# Patient Record
Sex: Female | Born: 1970 | Race: Black or African American | Hispanic: No | Marital: Single | Smoking: Never smoker
Health system: Southern US, Community
[De-identification: ages and names within clinical notes are randomized; demographics above are authoritative.]

## PROBLEM LIST (undated history)

## (undated) DIAGNOSIS — T783XXA Angioneurotic edema, initial encounter: Secondary | ICD-10-CM

## (undated) DIAGNOSIS — E669 Obesity, unspecified: Secondary | ICD-10-CM

## (undated) DIAGNOSIS — M1990 Unspecified osteoarthritis, unspecified site: Secondary | ICD-10-CM

## (undated) DIAGNOSIS — I10 Essential (primary) hypertension: Secondary | ICD-10-CM

## (undated) DIAGNOSIS — R928 Other abnormal and inconclusive findings on diagnostic imaging of breast: Secondary | ICD-10-CM

## (undated) DIAGNOSIS — T464X5A Adverse effect of angiotensin-converting-enzyme inhibitors, initial encounter: Secondary | ICD-10-CM

## (undated) DIAGNOSIS — E119 Type 2 diabetes mellitus without complications: Secondary | ICD-10-CM

## (undated) HISTORY — DX: Obesity, unspecified: E66.9

## (undated) HISTORY — PX: CHOLECYSTECTOMY: SHX55

## (undated) HISTORY — PX: APPENDECTOMY: SHX54

## (undated) HISTORY — DX: Adverse effect of angiotensin-converting-enzyme inhibitors, initial encounter: T46.4X5A

## (undated) HISTORY — DX: Angioneurotic edema, initial encounter: T78.3XXA

## (undated) HISTORY — DX: Essential (primary) hypertension: I10

## (undated) HISTORY — DX: Type 2 diabetes mellitus without complications: E11.9

## (undated) HISTORY — DX: Unspecified osteoarthritis, unspecified site: M19.90

## (undated) HISTORY — DX: Other abnormal and inconclusive findings on diagnostic imaging of breast: R92.8

---

## 1999-08-06 ENCOUNTER — Inpatient Hospital Stay (HOSPITAL_COMMUNITY): Admission: AD | Admit: 1999-08-06 | Discharge: 1999-08-06 | Payer: Self-pay | Admitting: Obstetrics & Gynecology

## 1999-08-06 ENCOUNTER — Encounter (INDEPENDENT_AMBULATORY_CARE_PROVIDER_SITE_OTHER): Payer: Self-pay | Admitting: Specialist

## 1999-11-24 ENCOUNTER — Encounter (INDEPENDENT_AMBULATORY_CARE_PROVIDER_SITE_OTHER): Payer: Self-pay | Admitting: Specialist

## 1999-11-24 ENCOUNTER — Other Ambulatory Visit: Admission: RE | Admit: 1999-11-24 | Discharge: 1999-11-24 | Payer: Self-pay | Admitting: Obstetrics & Gynecology

## 1999-12-03 ENCOUNTER — Encounter: Admission: RE | Admit: 1999-12-03 | Discharge: 1999-12-03 | Payer: Self-pay | Admitting: Family Medicine

## 1999-12-03 ENCOUNTER — Encounter: Payer: Self-pay | Admitting: Family Medicine

## 1999-12-21 ENCOUNTER — Encounter (INDEPENDENT_AMBULATORY_CARE_PROVIDER_SITE_OTHER): Payer: Self-pay | Admitting: *Deleted

## 1999-12-21 ENCOUNTER — Ambulatory Visit (HOSPITAL_COMMUNITY): Admission: RE | Admit: 1999-12-21 | Discharge: 1999-12-22 | Payer: Self-pay | Admitting: General Surgery

## 2000-11-07 ENCOUNTER — Other Ambulatory Visit: Admission: RE | Admit: 2000-11-07 | Discharge: 2000-11-07 | Payer: Self-pay | Admitting: *Deleted

## 2001-05-03 ENCOUNTER — Other Ambulatory Visit: Admission: RE | Admit: 2001-05-03 | Discharge: 2001-05-03 | Payer: Self-pay | Admitting: Obstetrics and Gynecology

## 2001-11-15 ENCOUNTER — Other Ambulatory Visit: Admission: RE | Admit: 2001-11-15 | Discharge: 2001-11-15 | Payer: Self-pay | Admitting: Obstetrics and Gynecology

## 2002-11-21 ENCOUNTER — Other Ambulatory Visit: Admission: RE | Admit: 2002-11-21 | Discharge: 2002-11-21 | Payer: Self-pay | Admitting: Obstetrics and Gynecology

## 2008-08-30 HISTORY — PX: REDUCTION MAMMAPLASTY: SUR839

## 2011-01-15 NOTE — Op Note (Signed)
[place]. [facility]  Patient:    Sheila Gutierrez, Sheila Gutierrez                       MRN: [id number] Proc. Date: 12/21/99 Adm. Date:  [id number] Attending:  Glenna Fellows Tappan                           Operative Report  PREOPERATIVE DIAGNOSIS:       Symptomatic cholelithiasis.  POSTOPERATIVE DIAGNOSIS:      Symptomatic cholelithiasis.  PROCEDURE:                    Laparoscopic cholecystectomy.  SURGEON:                      Lorne Skeens. Hoxworth, M.D.  ASSISTANT:                    Rose Phi. Maple Hudson, M.D.  ANESTHESIA:                   General.  BRIEF HISTORY:  Ms. Bischof is a 40 year old black female with repeated episodes of epigastric and right upper quadrant abdominal pain.  Workup included a gallbladder ultrasound showing multiple gallstones.  She was felt to have symptomatic cholelithiasis, and laparoscopic cholecystectomy has been recommended.  The nature of the procedure, the indications, the risks of bleeding, infection, bile duct _____ injury were discussed and understood. She is now brought to the operating room for this procedure.  DESCRIPTION OF PROCEDURE:  The patient was brought to the operating room and placed in the supine position on the operating table, and a general endotracheal anesthesia was induced.  The abdomen was sterilely prepped and draped.  Local anesthesia was used to infiltrate the trocar sites.  A 1 cm incision was made at the umbilicus, and dissection was carried down to the midline fascia.  This was sharply incised for 1 cm and the peritoneum entered under direct vision.  A Hasson trocar was placed through a mattress suture of 0 Vicryl.  Under direct vision, the 10 mm trocar was placed in the subxiphoid area and two 5 mm trocars on the right subcostal margin.  The gallbladder was visualized and the fundus grasped and elevated from the liver.  Some omental adhesions and filmy adhesions to the duodenum were carefully taken down.   The infundibulum was exposed and retracted inferolaterally.  Fibrofatty tissue was stripped off the neck of the gallbladder toward the porta hepatis.  The distal gallbladder and Calots triangle were thoroughly dissected.  The cystic duct-gallbladder junction was dissected 360 degrees.  When the anatomy was clear, the cystic duct was doubly clipped proximally and clipped distally and divided.  Anterior and posterior branches of the cystic artery were controlled in Calots triangle with clips, and the gallbladder was dissected free from bed using hooks, clamps, and cautery, and removed through the umbilicus intact.  Complete hemostasis was assured in the operative site.  Trocars were removed under direct vision and all CO2 evacuated.  The pursestring suture was secured at the umbilicus.  Skin incisions were closed with a interrupted subcuticular 4-0 Monocryl and Steri-Strips.  Sponge, needle, and instrument counts were correct.  A dressing was applied and the patient taken to the recovery room in good condition. DD:  12/21/99 TD:  12/21/99 Job: [id number]

## 2012-05-30 ENCOUNTER — Other Ambulatory Visit (HOSPITAL_BASED_OUTPATIENT_CLINIC_OR_DEPARTMENT_OTHER): Payer: Self-pay | Admitting: Obstetrics and Gynecology

## 2012-05-30 DIAGNOSIS — Z1231 Encounter for screening mammogram for malignant neoplasm of breast: Secondary | ICD-10-CM

## 2012-06-02 ENCOUNTER — Ambulatory Visit (HOSPITAL_BASED_OUTPATIENT_CLINIC_OR_DEPARTMENT_OTHER)
Admission: RE | Admit: 2012-06-02 | Discharge: 2012-06-02 | Disposition: A | Discharge status: Home / Self Care | Payer: Private Health Insurance - Indemnity | Source: Ambulatory Visit | Attending: Obstetrics and Gynecology | Admitting: Obstetrics and Gynecology

## 2012-06-02 DIAGNOSIS — Z1231 Encounter for screening mammogram for malignant neoplasm of breast: Secondary | ICD-10-CM | POA: Insufficient documentation

## 2012-06-02 DIAGNOSIS — R922 Inconclusive mammogram: Secondary | ICD-10-CM | POA: Insufficient documentation

## 2012-06-05 ENCOUNTER — Other Ambulatory Visit: Payer: Self-pay | Admitting: Obstetrics and Gynecology

## 2012-06-05 DIAGNOSIS — R928 Other abnormal and inconclusive findings on diagnostic imaging of breast: Secondary | ICD-10-CM

## 2012-10-27 ENCOUNTER — Ambulatory Visit
Admission: RE | Admit: 2012-10-27 | Discharge: 2012-10-27 | Disposition: A | Discharge status: Home / Self Care | Payer: Private Health Insurance - Indemnity | Source: Ambulatory Visit | Attending: Obstetrics and Gynecology | Admitting: Obstetrics and Gynecology

## 2012-10-27 DIAGNOSIS — R928 Other abnormal and inconclusive findings on diagnostic imaging of breast: Secondary | ICD-10-CM

## 2012-11-29 ENCOUNTER — Encounter: Payer: Self-pay | Admitting: Family Medicine

## 2012-11-29 ENCOUNTER — Ambulatory Visit (INDEPENDENT_AMBULATORY_CARE_PROVIDER_SITE_OTHER): Payer: Private Health Insurance - Indemnity | Admitting: Family Medicine

## 2012-11-29 VITALS — BP 175/95 | HR 105 | Ht 71.0 in | Wt 329.0 lb

## 2012-11-29 DIAGNOSIS — Z13220 Encounter for screening for lipoid disorders: Secondary | ICD-10-CM

## 2012-11-29 DIAGNOSIS — Z131 Encounter for screening for diabetes mellitus: Secondary | ICD-10-CM

## 2012-11-29 DIAGNOSIS — R928 Other abnormal and inconclusive findings on diagnostic imaging of breast: Secondary | ICD-10-CM

## 2012-11-29 DIAGNOSIS — E669 Obesity, unspecified: Secondary | ICD-10-CM

## 2012-11-29 DIAGNOSIS — Z87898 Personal history of other specified conditions: Secondary | ICD-10-CM | POA: Insufficient documentation

## 2012-11-29 DIAGNOSIS — S134XXA Sprain of ligaments of cervical spine, initial encounter: Secondary | ICD-10-CM

## 2012-11-29 DIAGNOSIS — Z9889 Other specified postprocedural states: Secondary | ICD-10-CM | POA: Insufficient documentation

## 2012-11-29 DIAGNOSIS — R030 Elevated blood-pressure reading, without diagnosis of hypertension: Secondary | ICD-10-CM

## 2012-11-29 DIAGNOSIS — S139XXA Sprain of joints and ligaments of unspecified parts of neck, initial encounter: Secondary | ICD-10-CM

## 2012-11-29 HISTORY — DX: Obesity, unspecified: E66.9

## 2012-11-29 HISTORY — DX: Other abnormal and inconclusive findings on diagnostic imaging of breast: R92.8

## 2012-11-29 MED ORDER — DICLOFENAC SODIUM 50 MG PO TBEC: DELAYED_RELEASE_TABLET | ORAL | Status: DC

## 2012-11-29 MED ORDER — CYCLOBENZAPRINE HCL 10 MG PO TABS: ORAL_TABLET | ORAL | Status: DC
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
## 2012-12-18 ENCOUNTER — Encounter: Payer: Self-pay | Admitting: Family Medicine

## 2012-12-18 LAB — LIPID PANEL: LDL Cholesterol: 66 mg/dL (ref 0–99)

## 2012-12-18 LAB — BASIC METABOLIC PANEL WITH GFR
BUN: 11 mg/dL (ref 6–23)
Calcium: 9.5 mg/dL (ref 8.4–10.5)
Creat: 0.65 mg/dL (ref 0.50–1.10)
GFR, Est African American: >89 mL/min
GFR, Est Non African American: >89 mL/min
Glucose, Bld: 78 mg/dL (ref 70–99)

## 2012-12-18 LAB — TSH: TSH: 1.657 u[IU]/mL (ref 0.350–4.500)

## 2012-12-22 ENCOUNTER — Ambulatory Visit (INDEPENDENT_AMBULATORY_CARE_PROVIDER_SITE_OTHER): Payer: Private Health Insurance - Indemnity | Admitting: Family Medicine

## 2012-12-22 ENCOUNTER — Encounter: Payer: Self-pay | Admitting: Family Medicine

## 2012-12-22 VITALS — BP 184/103 | HR 99 | Wt 332.0 lb

## 2012-12-22 DIAGNOSIS — Z0000 Encounter for general adult medical examination without abnormal findings: Secondary | ICD-10-CM

## 2012-12-22 DIAGNOSIS — I10 Essential (primary) hypertension: Secondary | ICD-10-CM

## 2012-12-22 DIAGNOSIS — Z23 Encounter for immunization: Secondary | ICD-10-CM

## 2012-12-22 HISTORY — DX: Essential (primary) hypertension: I10

## 2012-12-22 MED ORDER — HYDROCHLOROTHIAZIDE 25 MG PO TABS: 25.0000 mg | ORAL_TABLET | Freq: Every day | ORAL | Status: DC

## 2012-12-22 NOTE — Patient Instructions (Addendum)
Dr. Iwao Shamblin's General Advice Following Your Complete Physical Exam  The Benefits of Regular Exercise: Unless you suffer from an uncontrolled cardiovascular condition, studies strongly suggest that regular exercise and physical activity will add to both the quality and length of your life.  The World Health Organization recommends 150 minutes of moderate intensity aerobic activity every week.  This is best split over 3-4 days a week, and can be as simple as a brisk walk for just over 35 minutes "most days of the week".  This type of exercise has been shown to lower LDL-Cholesterol, lower average blood sugars, lower blood pressure, lower cardiovascular disease risk, improve memory, and increase one's overall sense of wellbeing.  The addition of anaerobic (or "strength training") exercises offers additional benefits including but not limited to increased metabolism, prevention of osteoporosis, and improved overall cholesterol levels.  How Can I Strive For A Low-Fat Diet?: Current guidelines recommend that 25-35 percent of your daily energy (food) intake should come from fats.  One might ask how can this be achieved without having to dissect each meal on a daily basis?  Switch to skim or 1% milk instead of whole milk.  Focus on lean meats such as ground turkey, fresh fish, baked chicken, and lean cuts of beef as your source of dietary protein.  Consume less than 300mg/day of dietary cholesterol.  Limit trans fatty acid consumption primarily by limiting synthetic trans fats such as partially hydrogenated oils (Ex: fried fast foods).  Focus efforts on reducing your intake of "solid" fats (Ex: Butter).  Substitute olive or vegetable oil for solid fats where possible.  Moderation of Salt Intake: Provided you don't carry a diagnosis of congestive heart failure nor renal failure, I recommend a daily allowance of no more than 2300 mg of salt (sodium).  Keeping under this daily goal is associated with a  decreased risk of cardiovascular events, creeping above it can lead to elevated blood pressures and increases your risk of cardiovascular events.  Milligrams (mg) of salt is listed on all nutrition labels, and your daily intake can add up faster than you think.  Most canned and frozen dinners can pack in over half your daily salt allowance in one meal.    Lifestyle Health Risks: Certain lifestyle choices carry specific health risks.  As you may already know, tobacco use has been associated with increasing one's risk of cardiovascular disease, pulmonary disease, numerous cancers, among many other issues.  What you may not know is that there are medications and nicotine replacement strategies that can more than double your chances of successfully quitting.  I would be thrilled to help manage your quitting strategy if you currently use tobacco products.  When it comes to alcohol use, I've yet to find an "ideal" daily allowance.  Provided an individual does not have a medical condition that is exacerbated by alcohol consumption, general guidelines determine "safe drinking" as no more than two standard drinks for a man or no more than one standard drink for a female per day.  However, much debate still exists on whether any amount of alcohol consumption is technically "safe".  My general advice, keep alcohol consumption to a minimum for general health promotion.  If you or others believe that alcohol, tobacco, or recreational drug use is interfering with your life, I would be happy to provide confidential counseling regarding treatment options.  General "Over The Counter" Nutrition Advice: Postmenopausal women should aim for a daily calcium intake of 1200 mg, however a significant   portion of this might already be provided by diets including milk, yogurt, cheese, and other dairy products.  Vitamin D has been shown to help preserve bone density, prevent fatigue, and has even been shown to help reduce falls in the  elderly.  Ensuring a daily intake of 800 Units of Vitamin D is a good place to start to enjoy the above benefits, we can easily check your Vitamin D level to see if you'd potentially benefit from supplementation beyond 800 Units a day.  Folic Acid intake should be of particular concern to women of childbearing age.  Daily consumption of 400-800 mcg of Folic Acid is recommended to minimize the chance of spinal cord defects in a fetus should pregnancy occur.    For many adults, accidents still remain one of the most common culprits when it comes to cause of death.  Some of the simplest but most effective preventitive habits you can adopt include regular seatbelt use, proper helmet use, securing firearms, and regularly testing your smoke and carbon monoxide detectors.  Mackay Hanauer B. [facility] DO [facility] [address], [address] [place], [place] [address] Phone: [phone]   DASH Diet The DASH diet stands for "Dietary Approaches to Stop Hypertension." It is a healthy eating plan that has been shown to reduce high blood pressure (hypertension) in as little as 14 days, while also possibly providing other significant health benefits. These other health benefits include reducing the risk of breast cancer after menopause and reducing the risk of type 2 diabetes, heart disease, colon cancer, and stroke. Health benefits also include weight loss and slowing kidney failure in patients with chronic kidney disease.  DIET GUIDELINES  Limit salt (sodium). Your diet should contain less than 1500 mg of sodium daily.  Limit refined or processed carbohydrates. Your diet should include mostly whole grains. Desserts and added sugars should be used sparingly.  Include small amounts of heart-healthy fats. These types of fats include nuts, oils, and tub margarine. Limit saturated and trans fats. These fats have been shown to be harmful in the body. CHOOSING FOODS  The following food groups are based on a 2000  calorie diet. See your Registered Dietitian for individual calorie needs. Grains and Grain Products (6 to 8 servings daily)  Eat More Often: Whole-wheat bread, brown rice, whole-grain or wheat pasta, quinoa, popcorn without added fat or salt (air popped).  Eat Less Often: White bread, white pasta, white rice, cornbread. Vegetables (4 to 5 servings daily)  Eat More Often: Fresh, frozen, and canned vegetables. Vegetables may be raw, steamed, roasted, or grilled with a minimal amount of fat.  Eat Less Often/Avoid: Creamed or fried vegetables. Vegetables in a cheese sauce. Fruit (4 to 5 servings daily)  Eat More Often: All fresh, canned (in natural juice), or frozen fruits. Dried fruits without added sugar. One hundred percent fruit juice ( cup [237 mL] daily).  Eat Less Often: Dried fruits with added sugar. Canned fruit in light or heavy syrup. [employer], Fish, and Poultry (2 servings or less daily. One serving is 3 to 4 oz [85-114 g]).  Eat More Often: Ninety percent or leaner ground beef, tenderloin, sirloin. Round cuts of beef, chicken breast, [place] breast. All fish. Grill, bake, or broil your meat. Nothing should be fried.  Eat Less Often/Avoid: Fatty cuts of meat, [place], or chicken leg, thigh, or wing. Fried cuts of meat or fish. Dairy (2 to 3 servings)  Eat More Often: Low-fat or fat-free milk, low-fat plain or  light yogurt, reduced-fat or part-skim cheese.  Eat Less Often/Avoid: Milk (whole, 2%).Whole milk yogurt. Full-fat cheeses. Nuts, Seeds, and Legumes (4 to 5 servings per week)  Eat More Often: All without added salt.  Eat Less Often/Avoid: Salted nuts and seeds, canned beans with added salt. Fats and Sweets (limited)  Eat More Often: Vegetable oils, tub margarines without trans fats, sugar-free gelatin. Mayonnaise and salad dressings.  Eat Less Often/Avoid: Coconut oils, palm oils, butter, stick margarine, cream, half and half, cookies, candy, pie. FOR MORE  INFORMATION The Dash Diet Eating Plan: www.dashdiet.org Document Released: 08/05/2011 Document Revised: 11/08/2011 Document Reviewed: 08/05/2011 [facility] Patient Information 2013 [place], [place].

## 2012-12-22 NOTE — Progress Notes (Signed)
CC: Sheila Gutierrez is a 42 y.o. female is here for Annual Exam   Subjective: HPI:  Colonoscopy: No family history of colon cancer, will start at age 55 Papsmear: She's had 3 consecutive normal Pap smears, most recent 2013 therefore up-to-date Mammogram: Repeat 6 months from march go to benign-appearing abnormalities   Influenza Vaccine: Out of season Pneumovax: Not indicated until age 45 Td/Tdap: Will receive Tdap today Zoster: (Start 42 yo)  Presents for complete physical exam without complaints. Breasts and gynecological exam recently performed at GYN office.  Review of Systems - General ROS: negative for - chills, fever, night sweats, weight gain or weight loss Ophthalmic ROS: negative for - decreased vision Psychological ROS: negative for - anxiety or depression ENT ROS: negative for - hearing change, nasal congestion, tinnitus or allergies Hematological and Lymphatic ROS: negative for - bleeding problems, bruising or swollen lymph nodes Breast ROS: negative Respiratory ROS: no cough, shortness of breath, or wheezing Cardiovascular ROS: no chest pain or dyspnea on exertion Gastrointestinal ROS: no abdominal pain, change in bowel habits, or black or bloody stools Genito-Urinary ROS: negative for - genital discharge, genital ulcers, incontinence or abnormal bleeding from genitals Musculoskeletal ROS: negative for - joint pain or muscle pain Neurological ROS: negative for - headaches or memory loss Dermatological ROS: negative for lumps, mole changes, rash and skin lesion changes  Past Medical History  Diagnosis Date  . Abnormal mammogram 11/29/2012    Repeat left diagnostic September 2014   . Elevated blood pressure 11/29/2012  . History of bilateral breast reduction surgery 11/29/2012  . Obesity 11/29/2012     Family History  Problem Relation Age of Onset  . Pancreatic cancer Mother   . Hypertension Mother     father  . Hypertension Father      History  Substance Use  Topics  . Smoking status: Never Smoker   . Smokeless tobacco: Not on file  . Alcohol Use: Yes     Comment: once a month     Objective: Filed Vitals:   12/22/12 0919  BP: 184/103  Pulse: 99    General: No Acute Distress HEENT: Atraumatic, normocephalic, conjunctivae normal without scleral icterus.  No nasal discharge, hearing grossly intact, TMs with good landmarks bilaterally with no middle ear abnormalities, posterior pharynx clear without oral lesions. Neck: Supple, trachea midline, no cervical nor supraclavicular adenopathy. Pulmonary: Clear to auscultation bilaterally without wheezing, rhonchi, nor rales. Cardiac: Regular rate and rhythm.  No murmurs, rubs, nor gallops. No peripheral edema.  2+ peripheral pulses bilaterally. Abdomen: Bowel sounds normal.  No masses.  Non-tender without rebound.  Negative Murphy's sign. GU: Deferred to GYN  MSK: Grossly intact, no signs of weakness.  Full strength throughout upper and lower extremities.  Full ROM in upper and lower extremities.  No midline spinal tenderness. Neuro: Gait unremarkable, CN II-XII grossly intact.  C5-C6 Reflex 2/4 Bilaterally, L4 Reflex 2/4 Bilaterally.  Cerebellar function intact. Skin: No rashes. Psych: Alert and oriented to person/place/time.  Thought process normal. No anxiety/depression.   Assessment & Plan: Sheila Gutierrez was seen today for annual exam.  Diagnoses and associated orders for this visit:  Annual physical exam - Tdap vaccine greater than or equal to 7yo IM  Essential hypertension, benign - hydrochlorothiazide (HYDRODIURIL) 25 MG tablet; Take 1 tablet (25 mg total) by mouth daily.  Other Orders - norethindrone (CAMILA) 0.35 MG tablet; Take 1 tablet (0.35 mg total) by mouth daily.    Healthy lifestyle interventions including but limited to  regular exercise, a healthy low fat diet, moderation of salt intake, the dangers of tobacco/alcohol/recreational drug use, nutrition supplementation, and  accident avoidance were discussed with the patient and a handout was provided for future reference.  Essential hypertension: Discussed diagnosis with patient and benefits of-diet and exercise. We'll start hydrochlorothiazide. TSH and renal function already checked.   We discussed her favorable labs that were obtained earlier this week  Return in about 4 weeks (around 01/19/2013) for BP Check.

## 2013-01-19 ENCOUNTER — Encounter: Payer: Self-pay | Admitting: Family Medicine

## 2013-01-19 ENCOUNTER — Ambulatory Visit (INDEPENDENT_AMBULATORY_CARE_PROVIDER_SITE_OTHER): Payer: Private Health Insurance - Indemnity | Admitting: Family Medicine

## 2013-01-19 VITALS — BP 155/94 | HR 95 | Wt 331.0 lb

## 2013-01-19 DIAGNOSIS — B360 Pityriasis versicolor: Secondary | ICD-10-CM

## 2013-01-19 DIAGNOSIS — I10 Essential (primary) hypertension: Secondary | ICD-10-CM

## 2013-01-19 MED ORDER — KETOCONAZOLE 2 % EX SHAM: MEDICATED_SHAMPOO | CUTANEOUS | Status: DC

## 2013-01-19 MED ORDER — LISINOPRIL-HYDROCHLOROTHIAZIDE 20-25 MG PO TABS: 1.0000 | ORAL_TABLET | Freq: Every day | ORAL | Status: DC

## 2013-01-19 NOTE — Progress Notes (Signed)
CC: Sheila Gutierrez is a 42 y.o. female is here for Hypertension   Subjective: HPI:  Followup hypertension: Has been taking hydrochlorothiazide on a daily basis without side effects. Notes no longer having occasional headache but no other changes subjectively. No outside blood pressures to report. Has cut back on salt drastically and somewhat increasing physical activity. Denies headaches, motor or sensory disturbances, chest pain shortness of breath, orthopnea, peripheral edema  Patient complains of a rash on her back with itching it has been present for years she notices it's only noticeable in the summer. Itching is mild on a daily basis improves with scratching worsened when sweating. No treatment or interventions as of yet. Denies fevers, chills, swollen lymph nodes, bruising   Review Of Systems Outlined In HPI  Past Medical History  Diagnosis Date  . Abnormal mammogram 11/29/2012    Repeat left diagnostic September 2014   . Elevated blood pressure 11/29/2012  . History of bilateral breast reduction surgery 11/29/2012  . Obesity 11/29/2012     Family History  Problem Relation Age of Onset  . Pancreatic cancer Mother   . Hypertension Mother     father  . Hypertension Father      History  Substance Use Topics  . Smoking status: Never Smoker   . Smokeless tobacco: Not on file  . Alcohol Use: Yes     Comment: once a month     Objective: Filed Vitals:   01/19/13 0912  BP: 155/94  Pulse: 95    General: Alert and Oriented, No Acute Distress HEENT: Pupils equal, round, reactive to light. Conjunctivae clear.  Moist mucous membranes Lungs: Clear to auscultation bilaterally, no wheezing/ronchi/rales.  Comfortable work of breathing. Good air movement. Cardiac: Regular rate and rhythm. Normal S1/S2.  No murmurs, rubs, nor gallops.   Extremities: No peripheral edema.  Strong peripheral pulses.  Mental Status: No depression, anxiety, nor agitation. Skin: Warm and dry. Hyperpigmented  patches with overlying scaling on the upper back  Assessment & Plan: Theodora was seen today for hypertension.  Diagnoses and associated orders for this visit:  Essential hypertension, benign - lisinopril-hydrochlorothiazide (PRINZIDE,ZESTORETIC) 20-25 MG per tablet; Take 1 tablet by mouth daily.  Tinea versicolor - ketoconazole (NIZORAL) 2 % shampoo; Apply as body wash to rash daily for 3 days, leave on for five minutes.  Repeat in one week.    Essential hypertension: Uncontrolled, adding lisinopril to regimen return for blood pressure check 4 weeks Tinea versicolor: Discussed diagnosis with patient and treatment including ketoconazole  Return in about 4 weeks (around 02/16/2013).

## 2013-03-23 ENCOUNTER — Ambulatory Visit: Payer: Private Health Insurance - Indemnity | Admitting: Family Medicine

## 2013-05-01 ENCOUNTER — Other Ambulatory Visit: Payer: Self-pay | Admitting: Family Medicine

## 2013-05-01 ENCOUNTER — Other Ambulatory Visit: Payer: Self-pay | Admitting: Obstetrics and Gynecology

## 2013-05-01 DIAGNOSIS — R921 Mammographic calcification found on diagnostic imaging of breast: Secondary | ICD-10-CM

## 2013-05-09 ENCOUNTER — Encounter: Payer: Self-pay | Admitting: Family Medicine

## 2013-05-09 ENCOUNTER — Other Ambulatory Visit: Payer: Self-pay | Admitting: Family Medicine

## 2013-05-09 ENCOUNTER — Ambulatory Visit
Admission: RE | Admit: 2013-05-09 | Discharge: 2013-05-09 | Disposition: A | Discharge status: Home / Self Care | Payer: 59 | Source: Ambulatory Visit | Attending: Family Medicine | Admitting: Family Medicine

## 2013-05-09 DIAGNOSIS — R921 Mammographic calcification found on diagnostic imaging of breast: Secondary | ICD-10-CM

## 2013-07-16 ENCOUNTER — Other Ambulatory Visit: Payer: Self-pay | Admitting: Family Medicine

## 2013-07-17 ENCOUNTER — Other Ambulatory Visit: Payer: Self-pay | Admitting: Family Medicine

## 2013-11-14 ENCOUNTER — Other Ambulatory Visit: Payer: Self-pay | Admitting: Obstetrics and Gynecology

## 2013-11-14 DIAGNOSIS — R921 Mammographic calcification found on diagnostic imaging of breast: Secondary | ICD-10-CM

## 2013-11-14 DIAGNOSIS — Z9889 Other specified postprocedural states: Secondary | ICD-10-CM

## 2013-12-24 ENCOUNTER — Ambulatory Visit (INDEPENDENT_AMBULATORY_CARE_PROVIDER_SITE_OTHER): Payer: Private Health Insurance - Indemnity | Admitting: Family Medicine

## 2013-12-24 ENCOUNTER — Encounter: Payer: Self-pay | Admitting: Family Medicine

## 2013-12-24 VITALS — BP 153/93 | HR 90 | Ht 71.0 in | Wt 347.0 lb

## 2013-12-24 DIAGNOSIS — Z309 Encounter for contraceptive management, unspecified: Secondary | ICD-10-CM

## 2013-12-24 DIAGNOSIS — J309 Allergic rhinitis, unspecified: Secondary | ICD-10-CM

## 2013-12-24 DIAGNOSIS — Z131 Encounter for screening for diabetes mellitus: Secondary | ICD-10-CM

## 2013-12-24 DIAGNOSIS — G8929 Other chronic pain: Secondary | ICD-10-CM

## 2013-12-24 DIAGNOSIS — M549 Dorsalgia, unspecified: Secondary | ICD-10-CM

## 2013-12-24 DIAGNOSIS — I10 Essential (primary) hypertension: Secondary | ICD-10-CM

## 2013-12-24 DIAGNOSIS — J302 Other seasonal allergic rhinitis: Secondary | ICD-10-CM

## 2013-12-24 DIAGNOSIS — Z13220 Encounter for screening for lipoid disorders: Secondary | ICD-10-CM

## 2013-12-24 DIAGNOSIS — R928 Other abnormal and inconclusive findings on diagnostic imaging of breast: Secondary | ICD-10-CM

## 2013-12-24 DIAGNOSIS — Z0000 Encounter for general adult medical examination without abnormal findings: Secondary | ICD-10-CM

## 2013-12-24 MED ORDER — LISINOPRIL-HYDROCHLOROTHIAZIDE 20-25 MG PO TABS: ORAL_TABLET | ORAL | Status: DC

## 2013-12-24 MED ORDER — NORETHINDRONE 0.35 MG PO TABS: 1.0000 | ORAL_TABLET | Freq: Every day | ORAL | Status: DC

## 2013-12-24 MED ORDER — MONTELUKAST SODIUM 10 MG PO TABS: 10.0000 mg | ORAL_TABLET | Freq: Every day | ORAL | Status: DC

## 2013-12-24 NOTE — Patient Instructions (Signed)
DASH Diet  The DASH diet stands for "Dietary Approaches to Stop Hypertension." It is a healthy eating plan that has been shown to reduce high blood pressure (hypertension) in as little as 14 days, while also possibly providing other significant health benefits. These other health benefits include reducing the risk of breast cancer after menopause and reducing the risk of type 2 diabetes, heart disease, colon cancer, and stroke. Health benefits also include weight loss and slowing kidney failure in patients with chronic kidney disease.   DIET GUIDELINES  · Limit salt (sodium). Your diet should contain less than 1500 mg of sodium daily.  · Limit refined or processed carbohydrates. Your diet should include mostly whole grains. Desserts and added sugars should be used sparingly.  · Include small amounts of heart-healthy fats. These types of fats include nuts, oils, and tub margarine. Limit saturated and trans fats. These fats have been shown to be harmful in the body.  CHOOSING FOODS   The following food groups are based on a 2000 calorie diet. See your Registered Dietitian for individual calorie needs.  Grains and Grain Products (6 to 8 servings daily)  · Eat More Often: Whole-wheat bread, brown rice, whole-grain or wheat pasta, quinoa, popcorn without added fat or salt (air popped).  · Eat Less Often: White bread, white pasta, white rice, cornbread.  Vegetables (4 to 5 servings daily)  · Eat More Often: Fresh, frozen, and canned vegetables. Vegetables may be raw, steamed, roasted, or grilled with a minimal amount of fat.  · Eat Less Often/Avoid: Creamed or fried vegetables. Vegetables in a cheese sauce.  Fruit (4 to 5 servings daily)  · Eat More Often: All fresh, canned (in natural juice), or frozen fruits. Dried fruits without added sugar. One hundred percent fruit juice (½ cup [237 mL] daily).  · Eat Less Often: Dried fruits with added sugar. Canned fruit in light or heavy syrup.  Lean Meats, Fish, and Poultry (2  servings or less daily. One serving is 3 to 4 oz [85-114 g]).  · Eat More Often: Ninety percent or leaner ground beef, tenderloin, sirloin. Round cuts of beef, chicken breast, turkey breast. All fish. Grill, bake, or broil your meat. Nothing should be fried.  · Eat Less Often/Avoid: Fatty cuts of meat, turkey, or chicken leg, thigh, or wing. Fried cuts of meat or fish.  Dairy (2 to 3 servings)  · Eat More Often: Low-fat or fat-free milk, low-fat plain or light yogurt, reduced-fat or part-skim cheese.  · Eat Less Often/Avoid: Milk (whole, 2%). Whole milk yogurt. Full-fat cheeses.  Nuts, Seeds, and Legumes (4 to 5 servings per week)  · Eat More Often: All without added salt.  · Eat Less Often/Avoid: Salted nuts and seeds, canned beans with added salt.  Fats and Sweets (limited)  · Eat More Often: Vegetable oils, tub margarines without trans fats, sugar-free gelatin. Mayonnaise and salad dressings.  · Eat Less Often/Avoid: Coconut oils, palm oils, butter, stick margarine, cream, half and half, cookies, candy, pie.  FOR MORE INFORMATION  The Dash Diet Eating Plan: www.dashdiet.org  Document Released: 08/05/2011 Document Revised: 11/08/2011 Document Reviewed: 08/05/2011  [employer]® Patient Information ©2014 [employer].

## 2013-12-24 NOTE — Progress Notes (Signed)
CC: Sheila Gutierrez is a 43 y.o. female is here for Annual Exam   Subjective: HPI:  Colonoscopy: No current indication Papsmear:  normal Pap smear 2013 will repeat 2018 at the latest  Mammogram:  overdue for diagnostic mammogram, will order today patient plans on calling to make this appointment.   Influenza Vaccine:  out of season Pneumovax:  no current indication Td/Tdap: Tdap 2014 Zoster: (Start 43 yo)   has been out of her blood pressure medication for over a week. No outside blood pressures to report.  Requesting refills on mini pill  Complains of seasonal allergies that are worsening over the past couple weeks despite use of Zyrtec on a daily basis. Described as watery eyes, runny nose.  Reports chronic upper back pain described as soreness after any workouts at the [facility] [employer].  Review of Systems - General ROS: negative for - chills, fever, night sweats, weight gain or weight loss Ophthalmic ROS: negative for - decreased vision Psychological ROS: negative for - anxiety or depression ENT ROS: negative for - hearing change, nasal congestion, tinnitus or allergies Hematological and Lymphatic ROS: negative for - bleeding problems, bruising or swollen lymph nodes Breast ROS: negative Respiratory ROS: no cough, shortness of breath, or wheezing Cardiovascular ROS: no chest pain or dyspnea on exertion Gastrointestinal ROS: no abdominal pain, change in bowel habits, or black or bloody stools Genito-Urinary ROS: negative for - genital discharge, genital ulcers, incontinence or abnormal bleeding from genitals Musculoskeletal ROS: negative for - joint pain or muscle pain other than that described above Neurological ROS: negative for - headaches or memory loss Dermatological ROS: negative for lumps, mole changes, rash and skin lesion changes  Past Medical History  Diagnosis Date  . Abnormal mammogram 11/29/2012    Repeat left diagnostic September 2014   . Elevated blood pressure 11/29/2012   . History of bilateral breast reduction surgery 11/29/2012  . Obesity 11/29/2012    No past surgical history on file. Family History  Problem Relation Age of Onset  . Pancreatic cancer Mother   . Hypertension Mother     father  . Hypertension Father     History   Social History  . Marital Status: Single    Spouse Name: N/A    Number of Children: N/A  . Years of Education: N/A   Occupational History  . Not on file.   Social History Main Topics  . Smoking status: Never Smoker   . Smokeless tobacco: Not on file  . Alcohol Use: Yes     Comment: once a month  . Drug Use: No  . Sexual Activity: Yes   Other Topics Concern  . Not on file   Social History Narrative  . No narrative on file     Objective: BP 153/93  Pulse 90  Ht 5\' 11"  (1.803 m)  Wt 347 lb (157.398 kg)  BMI 48.42 kg/m2  General: No Acute Distress HEENT: Atraumatic, normocephalic, conjunctivae normal without scleral icterus.  No nasal discharge, hearing grossly intact, TMs with good landmarks bilaterally with no middle ear abnormalities, posterior pharynx clear without oral lesions. Neck: Supple, trachea midline, no cervical nor supraclavicular adenopathy. Pulmonary: Clear to auscultation bilaterally without wheezing, rhonchi, nor rales. Cardiac: Regular rate and rhythm.  No murmurs, rubs, nor gallops. No peripheral edema.  2+ peripheral pulses bilaterally. Abdomen: Bowel sounds normal.  No masses.  Non-tender without rebound.  Negative Murphy's sign. MSK: Grossly intact, no signs of weakness.  Full strength throughout upper and lower extremities.  Full ROM in upper and lower extremities.  No midline spinal tenderness. Neuro: Gait unremarkable, CN II-XII grossly intact.  C5-C6 Reflex 2/4 Bilaterally, L4 Reflex 2/4 Bilaterally.  Cerebellar function intact. Skin: No rashes. Psych: Alert and oriented to person/place/time.  Thought process normal. No anxiety/depression.    Assessment & Plan: Sheila Gutierrez was seen  today for annual exam.  Diagnoses and associated orders for this visit:  Annual physical exam  Abnormal mammogram - MM Digital Diagnostic Bilat; Future  Lipid screening - Lipid panel  Diabetes mellitus screening - BASIC METABOLIC PANEL WITH GFR  Essential hypertension, benign - lisinopril-hydrochlorothiazide (PRINZIDE,ZESTORETIC) 20-25 MG per tablet; TAKE 1 TABLET BY MOUTH DAILY.  Unspecified contraceptive management - norethindrone (CAMILA) 0.35 MG tablet; Take 1 tablet (0.35 mg total) by mouth daily.  Chronic back pain - Ambulatory referral to Physical Therapy  Seasonal allergies - montelukast (SINGULAIR) 10 MG tablet; Take 1 tablet (10 mg total) by mouth at bedtime.    Healthy lifestyle interventions including but not limited to regular exercise, a healthy low fat diet, moderation of salt intake, the dangers of tobacco/alcohol/recreational drug use, nutrition supplementation, and accident avoidance were discussed with the patient and a handout was provided for future reference.  Start Singulair along with Zyrtec for allergies  Return within 1 month for repeat blood pressure check. Checking renal function today  Due for annual dyslipidemia screening and diabetic screening.  Physical therapy referral to help with chronic upper back pain   Return in about 4 weeks (around 01/21/2014) for blood pressure check.

## 2013-12-25 ENCOUNTER — Encounter: Payer: Self-pay | Admitting: Family Medicine

## 2013-12-25 LAB — BASIC METABOLIC PANEL WITH GFR
BUN: 14 mg/dL (ref 6–23)
CALCIUM: 9.6 mg/dL (ref 8.4–10.5)
CO2: 25 mEq/L (ref 19–32)
CREATININE: 0.63 mg/dL (ref 0.50–1.10)
Chloride: 103 mEq/L (ref 96–112)
Glucose, Bld: 112 mg/dL — ABNORMAL HIGH (ref 70–99)
Potassium: 4 mEq/L (ref 3.5–5.3)
Sodium: 139 mEq/L (ref 135–145)

## 2013-12-25 LAB — LIPID PANEL
CHOL/HDL RATIO: 2.4 ratio
Cholesterol: 130 mg/dL (ref 0–200)
HDL: 54 mg/dL (ref >39)
LDL CALC: 63 mg/dL (ref 0–99)
TRIGLYCERIDES: 63 mg/dL (ref <150)
VLDL: 13 mg/dL (ref 0–40)

## 2013-12-27 ENCOUNTER — Ambulatory Visit: Payer: Private Health Insurance - Indemnity

## 2014-01-11 ENCOUNTER — Ambulatory Visit
Admission: RE | Admit: 2014-01-11 | Discharge: 2014-01-11 | Disposition: A | Discharge status: Home / Self Care | Payer: 59 | Source: Ambulatory Visit | Attending: Obstetrics and Gynecology | Admitting: Obstetrics and Gynecology

## 2014-01-11 ENCOUNTER — Encounter: Payer: Self-pay | Admitting: Family Medicine

## 2014-01-11 DIAGNOSIS — R921 Mammographic calcification found on diagnostic imaging of breast: Secondary | ICD-10-CM

## 2014-01-11 DIAGNOSIS — Z9889 Other specified postprocedural states: Secondary | ICD-10-CM

## 2014-02-17 ENCOUNTER — Other Ambulatory Visit: Payer: Self-pay | Admitting: Family Medicine

## 2014-06-05 ENCOUNTER — Encounter: Payer: Self-pay | Admitting: Family Medicine

## 2014-06-05 ENCOUNTER — Other Ambulatory Visit: Payer: Self-pay | Admitting: Family Medicine

## 2014-06-05 ENCOUNTER — Ambulatory Visit (INDEPENDENT_AMBULATORY_CARE_PROVIDER_SITE_OTHER): Payer: 59 | Admitting: Family Medicine

## 2014-06-05 VITALS — BP 130/71 | HR 86 | Wt 340.0 lb

## 2014-06-05 DIAGNOSIS — R921 Mammographic calcification found on diagnostic imaging of breast: Secondary | ICD-10-CM

## 2014-06-05 DIAGNOSIS — M5430 Sciatica, unspecified side: Secondary | ICD-10-CM

## 2014-06-05 MED ORDER — PREDNISONE 20 MG PO TABS
ORAL_TABLET | ORAL | Status: AC
Start: 2014-06-05 — End: 2014-06-18

## 2014-06-05 NOTE — Progress Notes (Signed)
CC: Sheila Gutierrez is a 43 y.o. female is here for right lower back pain   Subjective: HPI:  Right low back pain is localized in the buttock that radiates down the back of the right leg all the way into the foot. It came on gradually 2 weeks ago and has been persistent to a mild-to-moderate degree since onset. It is absent when seated however returns with bending forward or standing for long periods of time. It is particularly makes better or worse no benefit from ibuprofen or Tylenol. She's never had this before and denies any recent or remote trauma or overexertion. She denies any skin changes overlying the site of discomfort in the leg and describes the leg covered as only discomfort. Denies any abdominal pain, genitourinary complaints nor motor or sensory disturbances other than that described above   Review Of Systems Outlined In HPI  Past Medical History  Diagnosis Date  . Abnormal mammogram 11/29/2012    Repeat left diagnostic September 2014   . Elevated blood pressure 11/29/2012  . History of bilateral breast reduction surgery 11/29/2012  . Obesity 11/29/2012    No past surgical history on file. Family History  Problem Relation Age of Onset  . Pancreatic cancer Mother   . Hypertension Mother     father  . Hypertension Father     History   Social History  . Marital Status: Single    Spouse Name: N/A    Number of Children: N/A  . Years of Education: N/A   Occupational History  . Not on file.   Social History Main Topics  . Smoking status: Never Smoker   . Smokeless tobacco: Not on file  . Alcohol Use: Yes     Comment: once a month  . Drug Use: No  . Sexual Activity: Yes   Other Topics Concern  . Not on file   Social History Narrative  . No narrative on file     Objective: BP 130/71  Pulse 86  Wt 340 lb (154.223 kg)  General: Alert and Oriented, No Acute Distress HEENT: Pupils equal, round, reactive to light. Conjunctivae clear.  Moist mucous membranes  pharynx unremarkable Lungs: Clear and comfortable work of breathing Cardiac: Regular rate and rhythm.  Back: No midline spinous process tenderness in the lumbar region, no paraspinal musculature tenderness to palpation in the lumbar region. Straight leg raise is positive on the right, Pearlean BrownieFaber and Fader on the right. Pain is reproduced beyond 20 of lumbar flexion. Full range of motion strength in the right lower extremity Extremities: No peripheral edema.  Strong peripheral pulses.  Mental Status: No depression, anxiety, nor agitation. Skin: Warm and dry.  Assessment & Plan: Sheila Gutierrez was seen today for right lower back pain.  Diagnoses and associated orders for this visit:  Sciatic leg pain - predniSONE (DELTASONE) 20 MG tablet; Three tabs daily days 1-3, two tabs daily days 4-6, one tab daily days 7-9, half tab daily days 10-13.    History exam suspicious for sciatica therefore start prednisone taper and begin home exercise rehabilitation exercises on a daily basis for the next 3 weeks. If no better in 2-3 weeks we'll obtain x-rays and possible referral to physical therapy   Return if symptoms worsen or fail to improve.

## 2014-06-06 ENCOUNTER — Telehealth: Payer: Self-pay

## 2014-06-06 NOTE — Telephone Encounter (Signed)
Tylenol 650 mg 2 tabs 3 times a day. That's a total of 3900 mg per day, less than 4000 mg daily maximum.

## 2014-06-06 NOTE — Telephone Encounter (Signed)
I have never seen her, I would be happy to see her to ascertain interventional treatment would be effective.

## 2014-06-06 NOTE — Telephone Encounter (Signed)
Patient advised.

## 2014-06-06 NOTE — Telephone Encounter (Signed)
Sheila DikeJennifer is still having right shoulder, right side and lower back pain. The prednisone has not helped with the pain. She wants a recommendation for what she can take for the pain.

## 2014-06-06 NOTE — Telephone Encounter (Signed)
She was wanting to give the prednisone time to work. What can she take with the prednisone that would help with the pain. Such as tylenol or ibuprofen. Dr Ivan AnchorsHommel has left for the day otherwise I would have sent message to him.

## 2014-06-10 ENCOUNTER — Telehealth: Payer: Self-pay

## 2014-06-10 DIAGNOSIS — M5430 Sciatica, unspecified side: Secondary | ICD-10-CM

## 2014-06-10 MED ORDER — GABAPENTIN 300 MG PO CAPS: 300.0000 mg | ORAL_CAPSULE | Freq: Three times a day (TID) | ORAL | Status: DC

## 2014-06-10 NOTE — Telephone Encounter (Signed)
Sheila Gutierrez is concerned because she is still having pain. The prednisone and the Tylenol has not helped with the pain. Please advise.

## 2014-06-10 NOTE — Telephone Encounter (Signed)
Pt.notified

## 2014-06-10 NOTE — Telephone Encounter (Signed)
Start gabapentin which I've sent [facility], if no better after 2-3 days then I'd recommend formal physical therapy which I've placed a referral for today.

## 2014-06-14 ENCOUNTER — Other Ambulatory Visit: Payer: Self-pay | Admitting: *Deleted

## 2014-06-14 DIAGNOSIS — I10 Essential (primary) hypertension: Secondary | ICD-10-CM

## 2014-06-14 MED ORDER — LISINOPRIL-HYDROCHLOROTHIAZIDE 20-25 MG PO TABS: ORAL_TABLET | ORAL | Status: DC

## 2014-06-14 NOTE — Telephone Encounter (Signed)
Refill sent for lisinopril and notation for office appointment needed to follow up - she was seen in April and was to f/u in 4weeks. Corliss SkainsJamie Argentina Kosch, CMA

## 2014-06-18 ENCOUNTER — Ambulatory Visit
Admission: RE | Admit: 2014-06-18 | Discharge: 2014-06-18 | Disposition: A | Discharge status: Home / Self Care | Payer: 59 | Source: Ambulatory Visit | Attending: Family Medicine | Admitting: Family Medicine

## 2014-06-18 DIAGNOSIS — R921 Mammographic calcification found on diagnostic imaging of breast: Secondary | ICD-10-CM

## 2014-06-20 ENCOUNTER — Ambulatory Visit (INDEPENDENT_AMBULATORY_CARE_PROVIDER_SITE_OTHER): Payer: 59 | Admitting: Physical Therapy

## 2014-06-20 DIAGNOSIS — M5381 Other specified dorsopathies, occipito-atlanto-axial region: Secondary | ICD-10-CM

## 2014-06-20 DIAGNOSIS — M2550 Pain in unspecified joint: Secondary | ICD-10-CM

## 2014-06-20 DIAGNOSIS — M6281 Muscle weakness (generalized): Secondary | ICD-10-CM

## 2014-06-20 DIAGNOSIS — M5430 Sciatica, unspecified side: Secondary | ICD-10-CM

## 2014-06-28 ENCOUNTER — Encounter: Payer: 59 | Admitting: Physical Therapy

## 2014-07-09 ENCOUNTER — Other Ambulatory Visit: Payer: Self-pay

## 2014-07-09 DIAGNOSIS — I10 Essential (primary) hypertension: Secondary | ICD-10-CM

## 2014-07-09 MED ORDER — LISINOPRIL-HYDROCHLOROTHIAZIDE 20-25 MG PO TABS: ORAL_TABLET | ORAL | Status: DC

## 2014-07-15 ENCOUNTER — Other Ambulatory Visit: Payer: Self-pay | Admitting: Family Medicine

## 2014-07-22 ENCOUNTER — Other Ambulatory Visit: Payer: Self-pay | Admitting: Family Medicine

## 2014-08-05 ENCOUNTER — Encounter: Payer: Self-pay | Admitting: Family Medicine

## 2014-08-05 ENCOUNTER — Ambulatory Visit (INDEPENDENT_AMBULATORY_CARE_PROVIDER_SITE_OTHER): Payer: 59 | Admitting: Family Medicine

## 2014-08-05 VITALS — BP 139/85 | HR 74 | Ht 71.0 in | Wt 345.0 lb

## 2014-08-05 DIAGNOSIS — R739 Hyperglycemia, unspecified: Secondary | ICD-10-CM

## 2014-08-05 DIAGNOSIS — I10 Essential (primary) hypertension: Secondary | ICD-10-CM

## 2014-08-05 LAB — BASIC METABOLIC PANEL WITH GFR
BUN: 12 mg/dL (ref 6–23)
CO2: 30 meq/L (ref 19–32)
CREATININE: 0.68 mg/dL (ref 0.50–1.10)
Calcium: 9.6 mg/dL (ref 8.4–10.5)
Chloride: 99 mEq/L (ref 96–112)
GFR, Est African American: >89 mL/min
Glucose, Bld: 104 mg/dL — ABNORMAL HIGH (ref 70–99)
Potassium: 4.1 mEq/L (ref 3.5–5.3)
Sodium: 140 mEq/L (ref 135–145)

## 2014-08-05 LAB — HEMOGLOBIN A1C
HEMOGLOBIN A1C: 6.8 % — AB (ref <5.7)
Mean Plasma Glucose: 148 mg/dL — ABNORMAL HIGH (ref <117)

## 2014-08-05 MED ORDER — MONTELUKAST SODIUM 10 MG PO TABS: ORAL_TABLET | ORAL | Status: DC

## 2014-08-05 MED ORDER — LISINOPRIL-HYDROCHLOROTHIAZIDE 20-25 MG PO TABS: ORAL_TABLET | ORAL | Status: DC

## 2014-08-05 NOTE — Progress Notes (Signed)
CC: Sheila Gutierrez is a 43 y.o. female is here for Follow-up   Subjective: HPI:  Follow-up hypertension: Continues on lisinopril-hydrochlorothiazide. No outside blood pressures to report. Denies chest pain shortness of breath orthopnea nor peripheral edema.  Follow-up hyperglycemia: Back in the spring she had a mildly elevated nonfasting blood sugar. There has been no polyuria plication or polydipsia. Currently not on any anti-hyperglycemics or addressing blood sugar with diet or exercise.   Review Of Systems Outlined In HPI  Past Medical History  Diagnosis Date  . Abnormal mammogram 11/29/2012    Repeat left diagnostic September 2014   . Elevated blood pressure 11/29/2012  . History of bilateral breast reduction surgery 11/29/2012  . Obesity 11/29/2012    No past surgical history on file. Family History  Problem Relation Age of Onset  . Pancreatic cancer Mother   . Hypertension Mother     father  . Hypertension Father     History   Social History  . Marital Status: Single    Spouse Name: N/A    Number of Children: N/A  . Years of Education: N/A   Occupational History  . Not on file.   Social History Main Topics  . Smoking status: Never Smoker   . Smokeless tobacco: Not on file  . Alcohol Use: Yes     Comment: once a month  . Drug Use: No  . Sexual Activity: Yes   Other Topics Concern  . Not on file   Social History Narrative  . No narrative on file     Objective: BP 139/85 mmHg  Pulse 74  Ht 5\' 11"  (1.803 m)  Wt 345 lb (156.491 kg)  BMI 48.14 kg/m2  General: Alert and Oriented, No Acute Distress HEENT: Pupils equal, round, reactive to light. Conjunctivae clear.   Lungs: Clear to auscultation bilaterally, no wheezing/ronchi/rales.  Comfortable work of breathing. Good air movement. Cardiac: Regular rate and rhythm. Normal S1/S2.  No murmurs, rubs, nor gallops.   Extremities: No peripheral edema.  Strong peripheral pulses.  Mental Status: No depression,  anxiety, nor agitation. Skin: Warm and dry.  Assessment & Plan: [name] was seen today for follow-up.  Diagnoses and associated orders for this visit:  Essential hypertension, benign - Hemoglobin A1c - BASIC METABOLIC PANEL WITH GFR - lisinopril-hydrochlorothiazide (PRINZIDE,ZESTORETIC) 20-25 MG per tablet; TAKE 1 TABLET BY MOUTH DAILY.  Hyperglycemia - Hemoglobin A1c - BASIC METABOLIC PANEL WITH GFR  Other Orders - montelukast (SINGULAIR) 10 MG tablet; TAKE 1 TABLET (10 MG TOTAL) BY MOUTH AT BEDTIME.    Essential hypertension: Controlled but not optimal, checking renal function continue lisinopril-hydrochlorothiazide. Focus on sodium restriction Hyperglycemia: Checking nonfasting blood sugar today and A1c  She requests refills on Singulair that she takes sporadically for allergies.  Return if symptoms worsen or fail to improve.

## 2014-08-06 ENCOUNTER — Encounter: Payer: Self-pay | Admitting: Family Medicine

## 2014-08-06 DIAGNOSIS — E119 Type 2 diabetes mellitus without complications: Secondary | ICD-10-CM

## 2014-08-06 HISTORY — DX: Type 2 diabetes mellitus without complications: E11.9

## 2014-08-23 ENCOUNTER — Other Ambulatory Visit: Payer: Self-pay | Admitting: Family Medicine

## 2014-10-15 ENCOUNTER — Other Ambulatory Visit: Payer: Self-pay | Admitting: *Deleted

## 2014-10-15 DIAGNOSIS — Z309 Encounter for contraceptive management, unspecified: Secondary | ICD-10-CM

## 2014-10-15 MED ORDER — NORETHINDRONE 0.35 MG PO TABS: 1.0000 | ORAL_TABLET | Freq: Every day | ORAL | Status: DC

## 2015-01-20 ENCOUNTER — Other Ambulatory Visit: Payer: Self-pay | Admitting: Family Medicine

## 2015-02-06 ENCOUNTER — Other Ambulatory Visit: Payer: Self-pay | Admitting: Family Medicine

## 2015-04-19 ENCOUNTER — Other Ambulatory Visit: Payer: Self-pay | Admitting: Family Medicine

## 2015-04-21 NOTE — Telephone Encounter (Signed)
Pt scheduled with f/u appt 

## 2015-04-28 ENCOUNTER — Other Ambulatory Visit: Payer: Self-pay | Admitting: Family Medicine

## 2015-05-07 ENCOUNTER — Encounter: Payer: 59 | Admitting: Family Medicine

## 2015-05-14 ENCOUNTER — Encounter: Payer: Self-pay | Admitting: Family Medicine

## 2015-05-14 ENCOUNTER — Ambulatory Visit (INDEPENDENT_AMBULATORY_CARE_PROVIDER_SITE_OTHER): Payer: 59 | Admitting: Family Medicine

## 2015-05-14 VITALS — BP 125/73 | HR 92 | Resp 16 | Ht 71.0 in | Wt 354.0 lb

## 2015-05-14 DIAGNOSIS — Z1239 Encounter for other screening for malignant neoplasm of breast: Secondary | ICD-10-CM | POA: Diagnosis not present

## 2015-05-14 DIAGNOSIS — E119 Type 2 diabetes mellitus without complications: Secondary | ICD-10-CM

## 2015-05-14 DIAGNOSIS — Z0000 Encounter for general adult medical examination without abnormal findings: Secondary | ICD-10-CM

## 2015-05-14 LAB — CBC
HEMATOCRIT: 38.1 % (ref 36.0–46.0)
HEMOGLOBIN: 12.2 g/dL (ref 12.0–15.0)
MCH: 26.9 pg (ref 26.0–34.0)
MCHC: 32 g/dL (ref 30.0–36.0)
MCV: 84.1 fL (ref 78.0–100.0)
MPV: 9.6 fL (ref 8.6–12.4)
Platelets: 322 10*3/uL (ref 150–400)
RBC: 4.53 MIL/uL (ref 3.87–5.11)
RDW: 15.6 % — ABNORMAL HIGH (ref 11.5–15.5)
WBC: 9.5 10*3/uL (ref 4.0–10.5)

## 2015-05-14 LAB — COMPLETE METABOLIC PANEL WITH GFR
ALBUMIN: 3.7 g/dL (ref 3.6–5.1)
ALK PHOS: 62 U/L (ref 33–115)
ALT: 26 U/L (ref 6–29)
AST: 14 U/L (ref 10–30)
BILIRUBIN TOTAL: 0.5 mg/dL (ref 0.2–1.2)
BUN: 14 mg/dL (ref 7–25)
CALCIUM: 9.1 mg/dL (ref 8.6–10.2)
CO2: 29 mmol/L (ref 20–31)
Chloride: 102 mmol/L (ref 98–110)
Creat: 0.57 mg/dL (ref 0.50–1.10)
GFR, Est African American: >89 mL/min (ref >=60)
GFR, Est Non African American: >89 mL/min (ref >=60)
GLUCOSE: 120 mg/dL — AB (ref 65–99)
Potassium: 4.3 mmol/L (ref 3.5–5.3)
SODIUM: 143 mmol/L (ref 135–146)
TOTAL PROTEIN: 6.8 g/dL (ref 6.1–8.1)

## 2015-05-14 LAB — LIPID PANEL
Cholesterol: 121 mg/dL — ABNORMAL LOW (ref 125–200)
HDL: 45 mg/dL — ABNORMAL LOW (ref >=46)
LDL CALC: 64 mg/dL (ref <130)
TRIGLYCERIDES: 59 mg/dL (ref <150)
Total CHOL/HDL Ratio: 2.7 Ratio (ref <=5.0)
VLDL: 12 mg/dL (ref <30)

## 2015-05-14 LAB — HEMOGLOBIN A1C
Hgb A1c MFr Bld: 7.8 % — ABNORMAL HIGH (ref <5.7)
MEAN PLASMA GLUCOSE: 177 mg/dL — AB (ref <117)

## 2015-05-14 LAB — TSH: TSH: 1.502 u[IU]/mL (ref 0.350–4.500)

## 2015-05-14 MED ORDER — PHENTERMINE HCL 37.5 MG PO TABS: 37.5000 mg | ORAL_TABLET | Freq: Every day | ORAL | Status: DC

## 2015-05-14 MED ORDER — NORETHINDRONE 0.35 MG PO TABS: 1.0000 | ORAL_TABLET | Freq: Every day | ORAL | Status: DC

## 2015-05-14 MED ORDER — LISINOPRIL-HYDROCHLOROTHIAZIDE 20-25 MG PO TABS: 1.0000 | ORAL_TABLET | Freq: Every day | ORAL | Status: DC

## 2015-05-14 MED ORDER — MONTELUKAST SODIUM 10 MG PO TABS: ORAL_TABLET | ORAL | Status: DC

## 2015-05-14 NOTE — Patient Instructions (Signed)
Dr. Harjot Dibello's General Advice Following Your Complete Physical Exam  The Benefits of Regular Exercise: Unless you suffer from an uncontrolled cardiovascular condition, studies strongly suggest that regular exercise and physical activity will add to both the quality and length of your life.  The World Health Organization recommends 150 minutes of moderate intensity aerobic activity every week.  This is best split over 3-4 days a week, and can be as simple as a brisk walk for just over 35 minutes "most days of the week".  This type of exercise has been shown to lower LDL-Cholesterol, lower average blood sugars, lower blood pressure, lower cardiovascular disease risk, improve memory, and increase one's overall sense of wellbeing.  The addition of anaerobic (or "strength training") exercises offers additional benefits including but not limited to increased metabolism, prevention of osteoporosis, and improved overall cholesterol levels.  How Can I Strive For A Low-Fat Diet?: Current guidelines recommend that 25-35 percent of your daily energy (food) intake should come from fats.  One might ask how can this be achieved without having to dissect each meal on a daily basis?  Switch to skim or 1% milk instead of whole milk.  Focus on lean meats such as ground turkey, fresh fish, baked chicken, and lean cuts of beef as your source of dietary protein.  Limit saturated fat consumption to less than 10% of your daily caloric intake.  Limit trans fatty acid consumption primarily by limiting synthetic trans fats such as partially hydrogenated oils (Ex: fried fast foods).  Substitute olive or vegetable oil for solid fats where possible.  Moderation of Salt Intake: Provided you don't carry a diagnosis of congestive heart failure nor renal failure, I recommend a daily allowance of no more than 2300 mg of salt (sodium).  Keeping under this daily goal is associated with a decreased risk of cardiovascular events, creeping  above it can lead to elevated blood pressures and increases your risk of cardiovascular events.  Milligrams (mg) of salt is listed on all nutrition labels, and your daily intake can add up faster than you think.  Most canned and frozen dinners can pack in over half your daily salt allowance in one meal.    Lifestyle Health Risks: Certain lifestyle choices carry specific health risks.  As you may already know, tobacco use has been associated with increasing one's risk of cardiovascular disease, pulmonary disease, numerous cancers, among many other issues.  What you may not know is that there are medications and nicotine replacement strategies that can more than double your chances of successfully quitting.  I would be thrilled to help manage your quitting strategy if you currently use tobacco products.  When it comes to alcohol use, I've yet to find an "ideal" daily allowance.  Provided an individual does not have a medical condition that is exacerbated by alcohol consumption, general guidelines determine "safe drinking" as no more than two standard drinks for a man or no more than one standard drink for a female per day.  However, much debate still exists on whether any amount of alcohol consumption is technically "safe".  My general advice, keep alcohol consumption to a minimum for general health promotion.  If you or others believe that alcohol, tobacco, or recreational drug use is interfering with your life, I would be happy to provide confidential counseling regarding treatment options.  General "Over The Counter" Nutrition Advice: Postmenopausal women should aim for a daily calcium intake of 1200 mg, however a significant portion of this might already be   provided by diets including milk, yogurt, cheese, and other dairy products.  Vitamin D has been shown to help preserve bone density, prevent fatigue, and has even been shown to help reduce falls in the elderly.  Ensuring a daily intake of 800 Units of  Vitamin D is a good place to start to enjoy the above benefits, we can easily check your Vitamin D level to see if you'd potentially benefit from supplementation beyond 800 Units a day.  Folic Acid intake should be of particular concern to women of childbearing age.  Daily consumption of 400-800 mcg of Folic Acid is recommended to minimize the chance of spinal cord defects in a fetus should pregnancy occur.    For many adults, accidents still remain one of the most common culprits when it comes to cause of death.  Some of the simplest but most effective preventitive habits you can adopt include regular seatbelt use, proper helmet use, securing firearms, and regularly testing your smoke and carbon monoxide detectors.  Sheila Monger B. Hartford Maulden DO [facility] [address], [address] [place], [place] [address] Phone: [phone]  

## 2015-05-14 NOTE — Progress Notes (Signed)
CC: Sheila Gutierrez is a 44 y.o. female is here for Annual Exam   Subjective: HPI:  Colonoscopy: No current indication, advised to begin at age 23 Papsmear: Normal 2013, patient declines today Mammogram: Normal last year, repeat in October, orders placed.  Influenza Vaccine: Patient declines today Pneumovax: No current indication Td/Tdap: UTD from 2014 Zoster: (Start 44 yo)  Requesting complete physical exam and refills on all medications.  Her only complaint today is difficulty losing weight. She's tried exercise regimens and dieting however she always finds some sort of excuse to stop either intervention. She wants know if there is a medication to help with reducing her portions.  Review of Systems - General ROS: negative for - chills, fever, night sweats, weight gain or weight loss Ophthalmic ROS: negative for - decreased vision Psychological ROS: negative for - anxiety or depression ENT ROS: negative for - hearing change, nasal congestion, tinnitus or allergies Hematological and Lymphatic ROS: negative for - bleeding problems, bruising or swollen lymph nodes Breast ROS: negative Respiratory ROS: no cough, shortness of breath, or wheezing Cardiovascular ROS: no chest pain or dyspnea on exertion Gastrointestinal ROS: no abdominal pain, change in bowel habits, or black or bloody stools Genito-Urinary ROS: negative for - genital discharge, genital ulcers, incontinence or abnormal bleeding from genitals Musculoskeletal ROS: negative for - joint pain or muscle pain Neurological ROS: negative for - headaches or memory loss Dermatological ROS: negative for lumps, mole changes, rash and skin lesion changes  Past Medical History  Diagnosis Date  . Abnormal mammogram 11/29/2012    Repeat left diagnostic September 2014   . Elevated blood pressure 11/29/2012  . History of bilateral breast reduction surgery 11/29/2012  . Obesity 11/29/2012    No past surgical history on file. Family History   Problem Relation Age of Onset  . Pancreatic cancer Mother   . Hypertension Mother     father  . Hypertension Father     Social History   Social History  . Marital Status: Single    Spouse Name: N/A  . Number of Children: N/A  . Years of Education: N/A   Occupational History  . Not on file.   Social History Main Topics  . Smoking status: Never Smoker   . Smokeless tobacco: Not on file  . Alcohol Use: Yes     Comment: once a month  . Drug Use: No  . Sexual Activity: Yes   Other Topics Concern  . Not on file   Social History Narrative     Objective: BP 125/73 mmHg  Pulse 92  Resp 16  Ht 5\' 11"  (1.803 m)  Wt 354 lb (160.573 kg)  BMI 49.39 kg/m2  SpO2 97%  General: No Acute Distress HEENT: Atraumatic, normocephalic, conjunctivae normal without scleral icterus.  No nasal discharge, hearing grossly intact, TMs with good landmarks bilaterally with no middle ear abnormalities, posterior pharynx clear without oral lesions. Neck: Supple, trachea midline, no cervical nor supraclavicular adenopathy. Pulmonary: Clear to auscultation bilaterally without wheezing, rhonchi, nor rales. Cardiac: Regular rate and rhythm.  No murmurs, rubs, nor gallops. No peripheral edema.  2+ peripheral pulses bilaterally. Abdomen: Bowel sounds normal.  No masses.  Non-tender without rebound.  Negative Murphy's sign. GU: Declined by patient  MSK: Grossly intact, no signs of weakness.  Full strength throughout upper and lower extremities.  Full ROM in upper and lower extremities.  No midline spinal tenderness. Neuro: Gait unremarkable, CN II-XII grossly intact.  C5-C6 Reflex 2/4 Bilaterally, L4 Reflex  2/4 Bilaterally.  Cerebellar function intact. Skin: No rashes. Psych: Alert and oriented to person/place/time.  Thought process normal. No anxiety/depression.   Assessment & Plan: Sheila Gutierrez was seen today for annual exam.  Diagnoses and all orders for this visit:  Annual physical exam -     MM  DIGITAL SCREENING BILATERAL -     Lipid panel -     CBC -     COMPLETE METABOLIC PANEL WITH GFR -     TSH  Screening for malignant neoplasm of breast -     MM DIGITAL SCREENING BILATERAL  Type 2 diabetes mellitus without complication -     Hemoglobin A1c  Other orders -     phentermine (ADIPEX-P) 37.5 MG tablet; Take 1 tablet (37.5 mg total) by mouth daily before breakfast. -     norethindrone (MICRONOR,CAMILA,ERRIN) 0.35 MG tablet; Take 1 tablet (0.35 mg total) by mouth daily. -     montelukast (SINGULAIR) 10 MG tablet; TAKE 1 TABLET (10 MG TOTAL) BY MOUTH AT BEDTIME. -     lisinopril-hydrochlorothiazide (PRINZIDE,ZESTORETIC) 20-25 MG per tablet; Take 1 tablet by mouth daily.  Healthy lifestyle interventions including but not limited to regular exercise, a healthy low fat diet, moderation of salt intake, the dangers of tobacco/alcohol/recreational drug use, nutrition supplementation, and accident avoidance were discussed with the patient and a handout was provided for future reference.  Discussed risks and benefits of phentermine, she would like to start this and I discussed that she has to come in for a monthly weight check and blood pressure check if she believes that the medication is helping.  Return in about 4 weeks (around 06/11/2015) for BP and weight check.

## 2015-05-15 ENCOUNTER — Telehealth: Payer: Self-pay | Admitting: Family Medicine

## 2015-05-15 MED ORDER — METFORMIN HCL 1000 MG PO TABS: ORAL_TABLET | ORAL | Status: DC

## 2015-05-15 NOTE — Telephone Encounter (Signed)
Pt.notified

## 2015-05-15 NOTE — Telephone Encounter (Signed)
Sue Lush, Will you please let patient know that her A1c was 7.8 with a goal of less than 7 for optimal blood sugar control.  I'd recommend that she start a blood sugar lowering medication called metformin that I[facility].  Her LDL cholesterol is well controlled.  Kidney function and liver function were normal.  I'd recommend she f/u in a month for a weight check to see how the phentermine is doing and then in three months to recheck her A1c.

## 2015-05-25 ENCOUNTER — Other Ambulatory Visit: Payer: Self-pay | Admitting: Family Medicine

## 2015-06-11 ENCOUNTER — Ambulatory Visit (INDEPENDENT_AMBULATORY_CARE_PROVIDER_SITE_OTHER): Payer: 59 | Admitting: Family Medicine

## 2015-06-11 VITALS — BP 125/72 | HR 92 | Resp 16 | Wt 339.0 lb

## 2015-06-11 DIAGNOSIS — R635 Abnormal weight gain: Secondary | ICD-10-CM

## 2015-06-11 MED ORDER — PHENTERMINE HCL 37.5 MG PO TABS
37.5000 mg | ORAL_TABLET | Freq: Every day | ORAL | Status: DC
Start: 2015-06-11 — End: 2015-07-09

## 2015-06-11 NOTE — Progress Notes (Signed)
   Subjective:    Patient ID: Sheila Gutierrez, female    DOB: 03-18-1971, 44 y.o.   MRN: [id number]  HPIPatient is here for blood pressure and weight check. Denies trouble sleeping palpitations or medication problems.    Review of Systems     Objective:   Physical Exam        Assessment & Plan:  Patient has lost weight. A refill for phentermine will be faxed to pharmacy. Patient advised to schedule a follow up with nurse in 30 days. Declines Flu vaccination today.

## 2015-06-13 ENCOUNTER — Other Ambulatory Visit: Payer: Self-pay | Admitting: Family Medicine

## 2015-06-15 NOTE — Progress Notes (Signed)
Agree with refill.

## 2015-07-02 ENCOUNTER — Other Ambulatory Visit: Payer: Self-pay

## 2015-07-02 MED ORDER — METFORMIN HCL 1000 MG PO TABS: ORAL_TABLET | ORAL | Status: DC

## 2015-07-09 ENCOUNTER — Ambulatory Visit (INDEPENDENT_AMBULATORY_CARE_PROVIDER_SITE_OTHER): Payer: 59

## 2015-07-09 ENCOUNTER — Other Ambulatory Visit: Payer: Self-pay | Admitting: Osteopathic Medicine

## 2015-07-09 ENCOUNTER — Ambulatory Visit (INDEPENDENT_AMBULATORY_CARE_PROVIDER_SITE_OTHER): Payer: 59 | Admitting: Family Medicine

## 2015-07-09 VITALS — BP 123/68 | HR 95 | Resp 16 | Wt 334.0 lb

## 2015-07-09 DIAGNOSIS — Z1231 Encounter for screening mammogram for malignant neoplasm of breast: Secondary | ICD-10-CM | POA: Diagnosis not present

## 2015-07-09 DIAGNOSIS — R635 Abnormal weight gain: Secondary | ICD-10-CM | POA: Diagnosis not present

## 2015-07-09 MED ORDER — PHENTERMINE HCL 37.5 MG PO TABS: 37.5000 mg | ORAL_TABLET | Freq: Every day | ORAL | Status: DC

## 2015-07-09 NOTE — Progress Notes (Signed)
   Subjective:    Patient ID: Sheila HeraldJennifer M Cassady, female    DOB: 01-Aug-1971, 44 y.o.   MRN: [id number]  HPIPatient here for blood pressure and weight check. Denies trouble sleeping, palpitations or medication problems.    Review of Systems     Objective:   Physical Exam        Assessment & Plan:  Patient has lost weight. A refill for phentermine will be faxed to pharmacy.Patient advised to schedule visit with nurse in 30 days.

## 2015-07-09 NOTE — Progress Notes (Signed)
Weight loss success, refill will be offered

## 2015-08-06 ENCOUNTER — Ambulatory Visit (INDEPENDENT_AMBULATORY_CARE_PROVIDER_SITE_OTHER): Payer: 59 | Admitting: Family Medicine

## 2015-08-06 VITALS — BP 146/76 | HR 97 | Wt 335.0 lb

## 2015-08-06 DIAGNOSIS — R635 Abnormal weight gain: Secondary | ICD-10-CM | POA: Diagnosis not present

## 2015-08-06 NOTE — Progress Notes (Signed)
Pt.notified

## 2015-08-06 NOTE — Progress Notes (Signed)
   Subjective:    Patient ID: Sheila HeraldJennifer M Gutierrez, female    DOB: 1970/09/23, 44 y.o.   MRN: [id number]  HPI  Sheila HeraldJennifer M Gutierrez is here for blood pressure and weight check. Diet and exercise is not going well. She states it was hard through the holidays. Denies trouble sleeping or palpitations.    Review of Systems     Objective:   Physical Exam        Assessment & Plan:  Abnormal weight gain - Patient has not lost weight. A refill will not be sent to pharmacy.

## 2015-08-13 ENCOUNTER — Ambulatory Visit: Payer: 59 | Admitting: Family Medicine

## 2015-09-03 ENCOUNTER — Encounter: Payer: Self-pay | Admitting: Family Medicine

## 2015-09-03 ENCOUNTER — Ambulatory Visit (INDEPENDENT_AMBULATORY_CARE_PROVIDER_SITE_OTHER): Payer: 59 | Admitting: Family Medicine

## 2015-09-03 VITALS — BP 170/94 | HR 84 | Wt 335.0 lb

## 2015-09-03 DIAGNOSIS — E119 Type 2 diabetes mellitus without complications: Secondary | ICD-10-CM | POA: Diagnosis not present

## 2015-09-03 DIAGNOSIS — I10 Essential (primary) hypertension: Secondary | ICD-10-CM | POA: Diagnosis not present

## 2015-09-03 DIAGNOSIS — E669 Obesity, unspecified: Secondary | ICD-10-CM

## 2015-09-03 MED ORDER — EMPAGLIFLOZIN 25 MG PO TABS: 25.0000 mg | ORAL_TABLET | Freq: Every day | ORAL | Status: DC

## 2015-09-03 NOTE — Progress Notes (Signed)
CC: Sheila Gutierrez is a 45 y.o. female is here for abnormal weight gain; Medication Management; and Hypertension   Subjective: HPI:  Obesity: phentermine lost its effectiveness last month.  She's been watching what she eats and participates in some exercise during the week.  She still wants to lose some weight. She's been unable to lose weight on her own this past month. She would still like to take something if it would be helpful for losing weight.  Follow-up essential hypertension: Continues on lisinopril-hydrochlorothiazide. No outside blood pressures report. No chest pain shortness of breath orthopnea nor peripheral edema.  Follow-up type 2 diabetes: She's been tolerating metformin without any side effects. No outside blood sugars to report   Review Of Systems Outlined In HPI  Past Medical History  Diagnosis Date  . Abnormal mammogram 11/29/2012    Repeat left diagnostic September 2014   . Elevated blood pressure 11/29/2012  . History of bilateral breast reduction surgery 11/29/2012  . Obesity 11/29/2012    No past surgical history on file. Family History  Problem Relation Age of Onset  . Pancreatic cancer Mother   . Hypertension Mother     father  . Hypertension Father     Social History   Social History  . Marital Status: Single    Spouse Name: N/A  . Number of Children: N/A  . Years of Education: N/A   Occupational History  . Not on file.   Social History Main Topics  . Smoking status: Never Smoker   . Smokeless tobacco: Not on file  . Alcohol Use: Yes     Comment: once a month  . Drug Use: No  . Sexual Activity: Yes   Other Topics Concern  . Not on file   Social History Narrative     Objective: BP 170/94 mmHg  Pulse 84  Wt 335 lb (151.955 kg)  Vital signs reviewed. General: Alert and Oriented, No Acute Distress HEENT: Pupils equal, round, reactive to light. Conjunctivae clear.  External ears unremarkable.  Moist mucous membranes. Lungs: Clear and  comfortable work of breathing, speaking in full sentences without accessory muscle use. Cardiac: Regular rate and rhythm.  Neuro: CN II-XII grossly intact, gait normal. Extremities: No peripheral edema.  Strong peripheral pulses.  Mental Status: No depression, anxiety, nor agitation. Logical though process. Skin: Warm and dry.  Assessment & Plan: Sheila Gutierrez was seen today for abnormal weight gain, medication management and hypertension.  Diagnoses and all orders for this visit:  Essential hypertension, benign  Type 2 diabetes mellitus without complication, without long-term current use of insulin (HCC)  Obesity  Other orders -     empagliflozin (JARDIANCE) 25 MG TABS tablet; Take 25 mg by mouth daily.   Obesity: Uncontrolled, discussed pills versus injections to help with weight loss. Since her blood pressure is elevated and she also has type 2 diabetes discussed the possibility of taking Jardiance which I've seen help with weight loss, blood sugar and blood pressure in many of my patients. We'll give this a try for the next 1 or 2 months and if not beneficial for all of the above we'll discuss alternative blood pressure and weight loss medications.  Return in about 2 months (around 11/01/2015).

## 2015-09-04 ENCOUNTER — Telehealth: Payer: Self-pay

## 2015-09-04 NOTE — Telephone Encounter (Signed)
Can you please call "Sheila Gutierrez" at [phone] and ask if she or any of her colleagues represent Sheila Gutierrez.  If so can you see if they can help with this situation since the coupon card makes it look like Ms. Sheila Gutierrez should be able to use it. (also please let Ms. Sheila Gutierrez know we're working on it).

## 2015-09-04 NOTE — Telephone Encounter (Signed)
Patient left a message stating the coupon card did not work and the cost is to much for [employer].

## 2015-09-11 NOTE — Telephone Encounter (Signed)
The Jardiance team is out the town this week but will follow up next week.

## 2015-09-12 NOTE — Telephone Encounter (Signed)
[place], Can you please see which pharmacy [name] tried to use then contact Achilles DunkDeidra Graham at [phone] to see if she can help figure out why the savings card doesn't work for this commercially insured patient?

## 2015-09-16 NOTE — Telephone Encounter (Signed)
Spoke with Sheila Gutierrez pt have to activate the card before using the coupon.   pt instructed to activate coupon before going to the pharmacy.

## 2015-09-23 ENCOUNTER — Telehealth: Payer: Self-pay

## 2015-09-23 NOTE — Telephone Encounter (Signed)
Patient has a coupon card for Jardiance the ID number is [id number].   RxBin [id number] [employer] Loyalty RxGRP [id number] ID [id number]

## 2015-11-04 ENCOUNTER — Other Ambulatory Visit: Payer: Self-pay | Admitting: Family Medicine

## 2015-11-04 NOTE — Telephone Encounter (Signed)
This medication was D/C in Nov.. Is this refill appropriate?

## 2015-11-04 NOTE — Telephone Encounter (Signed)
Medication should still be active, [facility] [address]

## 2015-11-12 ENCOUNTER — Other Ambulatory Visit: Payer: Self-pay

## 2015-11-12 MED ORDER — EMPAGLIFLOZIN 25 MG PO TABS: 25.0000 mg | ORAL_TABLET | Freq: Every day | ORAL | Status: DC

## 2015-11-14 NOTE — Telephone Encounter (Signed)
I called Pt and talk to her to let her know she is due for a f/u with Dr.Hommel and she states she will call back to schedule appt time

## 2015-12-20 ENCOUNTER — Other Ambulatory Visit: Payer: Self-pay | Admitting: Family Medicine

## 2016-04-19 ENCOUNTER — Other Ambulatory Visit: Payer: Self-pay | Admitting: Family Medicine

## 2016-04-30 ENCOUNTER — Other Ambulatory Visit: Payer: Self-pay | Admitting: Family Medicine

## 2016-05-03 ENCOUNTER — Other Ambulatory Visit: Payer: Self-pay | Admitting: Family Medicine

## 2016-05-04 ENCOUNTER — Other Ambulatory Visit: Payer: Self-pay

## 2016-05-04 MED ORDER — NORETHINDRONE 0.35 MG PO TABS: 1.0000 | ORAL_TABLET | Freq: Every day | ORAL | 11 refills | Status: DC

## 2016-05-19 ENCOUNTER — Ambulatory Visit (INDEPENDENT_AMBULATORY_CARE_PROVIDER_SITE_OTHER): Payer: 59 | Admitting: Family Medicine

## 2016-05-19 ENCOUNTER — Encounter: Payer: Self-pay | Admitting: Family Medicine

## 2016-05-19 VITALS — BP 158/83 | HR 79 | Wt 343.0 lb

## 2016-05-19 DIAGNOSIS — I10 Essential (primary) hypertension: Secondary | ICD-10-CM | POA: Diagnosis not present

## 2016-05-19 DIAGNOSIS — Z23 Encounter for immunization: Secondary | ICD-10-CM

## 2016-05-19 DIAGNOSIS — E119 Type 2 diabetes mellitus without complications: Secondary | ICD-10-CM

## 2016-05-19 DIAGNOSIS — Z114 Encounter for screening for human immunodeficiency virus [HIV]: Secondary | ICD-10-CM | POA: Diagnosis not present

## 2016-05-19 LAB — CBC
HCT: 38.7 % (ref 35.0–45.0)
Hemoglobin: 12.3 g/dL (ref 11.7–15.5)
MCH: 27 pg (ref 27.0–33.0)
MCHC: 31.8 g/dL — AB (ref 32.0–36.0)
MCV: 84.9 fL (ref 80.0–100.0)
MPV: 9.9 fL (ref 7.5–12.5)
Platelets: 309 10*3/uL (ref 140–400)
RBC: 4.56 MIL/uL (ref 3.80–5.10)
RDW: 16 % — AB (ref 11.0–15.0)
WBC: 10.8 10*3/uL (ref 3.8–10.8)

## 2016-05-19 LAB — LIPID PANEL
CHOLESTEROL: 136 mg/dL (ref 125–200)
HDL: 63 mg/dL (ref >=46)
LDL Cholesterol: 60 mg/dL (ref <130)
TRIGLYCERIDES: 66 mg/dL (ref <150)
Total CHOL/HDL Ratio: 2.2 Ratio (ref <=5.0)
VLDL: 13 mg/dL (ref <30)

## 2016-05-19 LAB — COMPREHENSIVE METABOLIC PANEL
ALBUMIN: 3.6 g/dL (ref 3.6–5.1)
ALT: 34 U/L — ABNORMAL HIGH (ref 6–29)
AST: 20 U/L (ref 10–35)
Alkaline Phosphatase: 58 U/L (ref 33–115)
BILIRUBIN TOTAL: 0.5 mg/dL (ref 0.2–1.2)
BUN: 14 mg/dL (ref 7–25)
CALCIUM: 9.6 mg/dL (ref 8.6–10.2)
CHLORIDE: 102 mmol/L (ref 98–110)
CO2: 27 mmol/L (ref 20–31)
CREATININE: 0.6 mg/dL (ref 0.50–1.10)
Glucose, Bld: 97 mg/dL (ref 65–99)
Potassium: 4 mmol/L (ref 3.5–5.3)
SODIUM: 140 mmol/L (ref 135–146)
Total Protein: 6.8 g/dL (ref 6.1–8.1)

## 2016-05-19 LAB — TSH: TSH: 1.28 mIU/L

## 2016-05-19 LAB — POCT GLYCOSYLATED HEMOGLOBIN (HGB A1C): HEMOGLOBIN A1C: 6.6

## 2016-05-19 LAB — HIV ANTIBODY (ROUTINE TESTING W REFLEX): HIV: NONREACTIVE

## 2016-05-19 MED ORDER — METFORMIN HCL 1000 MG PO TABS: 1000.0000 mg | ORAL_TABLET | Freq: Two times a day (BID) | ORAL | 3 refills | Status: DC

## 2016-05-19 MED ORDER — AMLODIPINE BESYLATE 10 MG PO TABS: 10.0000 mg | ORAL_TABLET | Freq: Every day | ORAL | 3 refills | Status: DC

## 2016-05-19 MED ORDER — LISINOPRIL-HYDROCHLOROTHIAZIDE 20-25 MG PO TABS: 1.0000 | ORAL_TABLET | Freq: Every day | ORAL | 3 refills | Status: DC

## 2016-05-19 NOTE — Patient Instructions (Addendum)
Thank you for coming in today. You should hear from dietitian and from general surgery soon. Get fasting labs soon. Return in one month for recheck and well exam including Pap smear. Return sooner if needed.  Add amlodipine daily.

## 2016-05-19 NOTE — Progress Notes (Signed)
Sheila HeraldJennifer M Gutierrez is a 45 y.o. female who [facility] today for Discuss hypertension, diabetes, and morbid obesity.  Hypertension: Patient currently takes lisinopril/hydrochlorothiazide. She denies chest pain palpitations shortness of breath fevers or chills.  Diabetes: Patient is taking 2 diabetes with no problems that she knows of. She takes metformin 1000 mg twice daily. She cannot afford Jardiance. She denies polyuria or polydipsia. She ran out of metformin a few days ago. She has not had a diabetic eye exam recently.  Morbid obesity: Patient has tried and failed to lose weight many times over the course of her life. She has used calorie counting programs exercise etc. She is very frustrated. She recognizes obesity is the main problem in her life area and she is interested in learning more about her weight loss options including surgery.   Past Medical History:  Diagnosis Date  . Abnormal mammogram 11/29/2012   Repeat left diagnostic September 2014   . Elevated blood pressure 11/29/2012  . History of bilateral breast reduction surgery 11/29/2012  . Obesity 11/29/2012   No past surgical history on file. Social History  Substance Use Topics  . Smoking status: Never Smoker  . Smokeless tobacco: Not on file  . Alcohol use Yes     Comment: once a month   family history includes Hypertension in her father and mother; Pancreatic cancer in her mother.  ROS as above:  Medications: Current Outpatient Prescriptions  Medication Sig Dispense Refill  . norethindrone (MICRONOR,CAMILA,ERRIN) 0.35 MG tablet Take 1 tablet (0.35 mg total) by mouth daily. 28 tablet 11  . AMBULATORY NON FORMULARY MEDICATION Oral contraceptive (unknown dose/agent[s])    . amLODipine (NORVASC) 10 MG tablet Take 1 tablet (10 mg total) by mouth daily. 90 tablet 3  .  lisinopril-hydrochlorothiazide (PRINZIDE,ZESTORETIC) 20-25 MG tablet Take 1 tablet by mouth daily. 90 tablet 3  . metFORMIN (GLUCOPHAGE) 1000 MG tablet Take 1 tablet (1,000 mg total) by mouth 2 (two) times daily with a meal. 180 tablet 3   No current facility-administered medications for this visit.    No Known Allergies   Exam:  BP (!) 158/83   Pulse 79   Wt (!) 343 lb (155.6 kg)   LMP 05/19/2016   BMI 47.84 kg/m  Gen: Well NAD Morbidly obese HEENT: EOMI,  MMM Lungs: Normal work of breathing. CTABL Heart: RRR no MRG Abd: NABS, Soft. Nondistended, Nontender Exts: Brisk capillary refill, warm and well perfused.  Diabetic Foot Exam - Simple   Simple Foot Form Visual Inspection No deformities, no ulcerations, no other skin breakdown bilaterally:  Yes Sensation Testing Intact to touch and monofilament testing bilaterally:  Yes Pulse Check Posterior Tibialis and Dorsalis pulse intact bilaterally:  Yes Comments       Results for orders placed or performed in visit on 05/19/16 (from the past 24 hour(s))  POCT HgB A1C     Status: None   Collection Time: 05/19/16 10:30 AM  Result Value Ref Range   Hemoglobin A1C 6.6    No results found.    Assessment and Plan: 45 y.o. female with   Hypertension: Not well controlled. Add amlodipine. Recheck in 1 month  Diabetes: Doing well. Focus on diet and exercise. Obtain fasting labs.  Morbid obesity: Discussed various options. Plan for exercise and refer to register dietitian. Additionally refer to bariatric surgery for evaluation.  Medically supervised weight loss documentation:  Date of visit: 05/19/16  Patient  name: Sheila Gutierrez  Date of Birth: 1971/04/01  Weight:  Vitals:   05/19/16 0846  BP: (!) 158/83  Pulse: 79    BMI: Body mass index is 47.84 kg/m.   List of obesity-related comorbidities:  Diabetes and HTN  Current medications:  has a current medication list which includes the following  prescription(s): norethindrone, AMBULATORY NON FORMULARY MEDICATION, amlodipine, lisinopril-hydrochlorothiazide, and metformin.  Diet education discussed today:  Yes  Exercise activity discussed today:  Yes  Behavior/diet goals:  Add exercise 3x week.  Refer dietitian.       Orders Placed This Encounter  Procedures  . Flu Vaccine QUAD 36+ mos IM  . Pneumococcal polysaccharide vaccine 23-valent greater than or equal to 2yo subcutaneous/IM  . CBC  . Comprehensive metabolic panel    Order Specific Question:   Has the patient fasted?    Answer:   No  . Lipid panel    Order Specific Question:   Has the patient fasted?    Answer:   No  . TSH  . VITAMIN D 25 Hydroxy (Vit-D Deficiency, Fractures)  . HIV antibody  . Ambulatory referral to General Surgery    Referral Priority:   Routine    Referral Type:   Surgical    Referral Reason:   Specialty Services Required    Requested Specialty:   General Surgery    Number of Visits Requested:   1  . POCT HgB A1C    Discussed warning signs or symptoms. Please see discharge instructions. Patient expresses understanding.

## 2016-05-20 LAB — VITAMIN D 25 HYDROXY (VIT D DEFICIENCY, FRACTURES): Vit D, 25-Hydroxy: 27 ng/mL — ABNORMAL LOW (ref 30–100)

## 2016-06-14 ENCOUNTER — Other Ambulatory Visit: Payer: Self-pay | Admitting: Family Medicine

## 2016-06-14 DIAGNOSIS — Z1231 Encounter for screening mammogram for malignant neoplasm of breast: Secondary | ICD-10-CM

## 2016-06-16 ENCOUNTER — Encounter: Payer: 59 | Admitting: Family Medicine

## 2016-07-12 ENCOUNTER — Telehealth: Payer: Self-pay | Admitting: Family Medicine

## 2016-07-12 NOTE — Telephone Encounter (Signed)
Call pt: seen when last eye exam for her diabetes and when last pap smear.  Please get entered into system.

## 2016-07-14 ENCOUNTER — Ambulatory Visit (INDEPENDENT_AMBULATORY_CARE_PROVIDER_SITE_OTHER): Payer: Managed Care, Other (non HMO) | Admitting: Family Medicine

## 2016-07-14 ENCOUNTER — Ambulatory Visit (INDEPENDENT_AMBULATORY_CARE_PROVIDER_SITE_OTHER): Payer: Managed Care, Other (non HMO)

## 2016-07-14 ENCOUNTER — Encounter: Payer: Self-pay | Admitting: Family Medicine

## 2016-07-14 ENCOUNTER — Other Ambulatory Visit (HOSPITAL_COMMUNITY)
Admission: RE | Admit: 2016-07-14 | Discharge: 2016-07-14 | Disposition: A | Discharge status: Home / Self Care | Payer: Managed Care, Other (non HMO) | Source: Ambulatory Visit | Attending: Family Medicine | Admitting: Family Medicine

## 2016-07-14 VITALS — BP 141/79 | HR 88 | Ht 71.0 in | Wt 341.0 lb

## 2016-07-14 DIAGNOSIS — Z1151 Encounter for screening for human papillomavirus (HPV): Secondary | ICD-10-CM | POA: Insufficient documentation

## 2016-07-14 DIAGNOSIS — Z124 Encounter for screening for malignant neoplasm of cervix: Secondary | ICD-10-CM

## 2016-07-14 DIAGNOSIS — Z113 Encounter for screening for infections with a predominantly sexual mode of transmission: Secondary | ICD-10-CM | POA: Insufficient documentation

## 2016-07-14 DIAGNOSIS — Z1231 Encounter for screening mammogram for malignant neoplasm of breast: Secondary | ICD-10-CM | POA: Diagnosis not present

## 2016-07-14 DIAGNOSIS — Z0000 Encounter for general adult medical examination without abnormal findings: Secondary | ICD-10-CM | POA: Diagnosis not present

## 2016-07-14 DIAGNOSIS — Z01419 Encounter for gynecological examination (general) (routine) without abnormal findings: Secondary | ICD-10-CM | POA: Insufficient documentation

## 2016-07-14 NOTE — Patient Instructions (Signed)
Thank you for coming in today. We will resend the information to [facility] about your bariatric surgery referral.  You can call yourself [phone].   You should hear back soon about the pap smear results.  Call or go to the emergency room if you get worse, have trouble breathing, have chest pains, or palpitations.   Recheck in 3 months or so.   Pap Test Why am I having this test? A pap test is sometimes called a pap smear. It is a screening test that is used to check for signs of cancer of the vagina, cervix, and uterus. The test can also identify the presence of infection or precancerous changes. Your health care provider will likely recommend you have this test done on a regular basis. This test may be done:  Every 3 years, starting at age 45.  Every 5 years, in combination with testing for the presence of human papillomavirus (HPV).  More or less often depending on other medical conditions. What kind of sample is taken? Using a small cotton swab, plastic spatula, or brush, your health care provider will collect a sample of cells from the surface of your cervix. Your cervix is the opening to your uterus, also called a womb. Secretions from the cervix and vagina may also be collected. How do I prepare for this test?  Be aware of where you are in your menstrual cycle. You may be asked to reschedule the test if you are menstruating on the day of the test.  You may need to reschedule if you have a known vaginal infection on the day of the test.  You may be asked to avoid douching or taking a bath the day before or the day of the test.  Some medicines can cause abnormal test results, such as digitalis and tetracycline. Talk with your health care provider before your test if you take one of these medicines. What do the results mean? Abnormal test results may indicate a number of health conditions. These may include:  Cancer. Although pap test results cannot be used to  diagnose cancer of the cervix, vagina, or uterus, they may suggest the possibility of cancer. Further tests would be required to determine if cancer is present.  Sexually transmitted disease.  Fungal infection.  Parasite infection.  Herpes infection.  A condition causing or contributing to infertility. It is your responsibility to obtain your test results. Ask the lab or department performing the test when and how you will get your results. Contact your health care provider to discuss any questions you have about your results. Talk with your health care provider to discuss your results, treatment options, and if necessary, the need for more tests. Talk with your health care provider if you have any questions about your results. This information is not intended to replace advice given to you by your health care provider. Make sure you discuss any questions you have with your health care provider. Document Released: 11/06/2002 Document Revised: 04/21/2016 Document Reviewed: 01/07/2014 Elsevier Interactive Patient Education  2017 [employer]

## 2016-07-14 NOTE — Progress Notes (Signed)
       Sheila Gutierrez is a 45 y.o. female who presents to [facility] today for wellness visit.  Patient presents to clinic today for wellness visit. In the last several months she has done pretty well and is taking the medications listed below without any side effects or problems. She cannot recall the last time she had a Pap smear. It's also been sometime since her last mammogram. She notes that she never heard back from bariatric surgery referral and September. She continues to find it to be very difficult to lose weight.   Past Medical History:  Diagnosis Date  . Abnormal mammogram 11/29/2012   Repeat left diagnostic September 2014   . Elevated blood pressure 11/29/2012  . History of bilateral breast reduction surgery 11/29/2012  . Obesity 11/29/2012   History reviewed. No pertinent surgical history. Social History  Substance Use Topics  . Smoking status: Never Smoker  . Smokeless tobacco: Never Used  . Alcohol use Yes     Comment: once a month   family history includes Hypertension in her father and mother; Pancreatic cancer in her mother.  ROS as above:  Medications: Current Outpatient Prescriptions  Medication Sig Dispense Refill  . AMBULATORY NON FORMULARY MEDICATION Oral contraceptive (unknown dose/agent[s])    . amLODipine (NORVASC) 10 MG tablet Take 1 tablet (10 mg total) by mouth daily. 90 tablet 3  . lisinopril-hydrochlorothiazide (PRINZIDE,ZESTORETIC) 20-25 MG tablet Take 1 tablet by mouth daily. 90 tablet 3  . metFORMIN (GLUCOPHAGE) 1000 MG tablet Take 1 tablet (1,000 mg total) by mouth 2 (two) times daily with a meal. 180 tablet 3  . norethindrone (MICRONOR,CAMILA,ERRIN) 0.35 MG tablet Take 1 tablet (0.35 mg total) by mouth daily. 28 tablet 11   No current facility-administered medications for this visit.    No Known Allergies  Health  Maintenance Health Maintenance  Topic Date Due  . OPHTHALMOLOGY EXAM  12/14/1980  . PAP SMEAR  11/11/2015  . HEMOGLOBIN A1C  11/16/2016  . FOOT EXAM  05/19/2017  . PNEUMOCOCCAL POLYSACCHARIDE VACCINE (2) 05/19/2021  . TETANUS/TDAP  12/23/2022  . INFLUENZA VACCINE  Completed  . HIV Screening  Completed     Exam:  BP (!) 141/79   Pulse 88   Ht 5\' 11"  (1.803 m)   Wt (!) 341 lb (154.7 kg)   BMI 47.56 kg/m  Gen: Well NAD Morbidly obese HEENT: EOMI,  MMM Lungs: Normal work of breathing. CTABL Heart: RRR no MRG Abd: NABS, Soft. Nondistended, Nontender Exts: Brisk capillary refill, warm and well perfused.  GYN: Normal external genitalia. Vaginal canal with thin white discharge. Normal-appearing cervix. Nontender.   No results found for this or any previous visit (from the past 72 hour(s)). No results found.    Assessment and Plan: 45 y.o. female with  Well adult visit with Pap smear. Pap smear performed with HPV testing and GC chlamydia testing. Recent laboratory reviewed. Blood pressure seems well-controlled. Continue current treatment. Recheck in 3 months with PCP.   No orders of the defined types were placed in this encounter.   Discussed warning signs or symptoms. Please see discharge instructions. Patient expresses understanding.

## 2016-07-15 NOTE — Telephone Encounter (Signed)
Pt had appointment yesterday.

## 2016-07-16 LAB — CYTOLOGY - PAP
Chlamydia: NEGATIVE
DIAGNOSIS: NEGATIVE
HPV: NOT DETECTED
Neisseria Gonorrhea: NEGATIVE
TRICH (WINDOWPATH): NEGATIVE

## 2017-04-26 ENCOUNTER — Other Ambulatory Visit: Payer: Self-pay

## 2017-04-26 MED ORDER — NORETHINDRONE 0.35 MG PO TABS: 1.0000 | ORAL_TABLET | Freq: Every day | ORAL | 0 refills | Status: DC

## 2017-05-10 ENCOUNTER — Other Ambulatory Visit: Payer: Self-pay | Admitting: Family Medicine

## 2017-05-10 DIAGNOSIS — E119 Type 2 diabetes mellitus without complications: Secondary | ICD-10-CM

## 2017-05-10 NOTE — Telephone Encounter (Signed)
Called pt to inform her that we need to see her in the clinic for a check up. Pt will call back and schedule appt. 30 day supply sent in.

## 2017-05-19 MED ORDER — NORETHINDRONE 0.35 MG PO TABS: 1.0000 | ORAL_TABLET | Freq: Every day | ORAL | 0 refills | Status: DC

## 2017-05-19 NOTE — Telephone Encounter (Signed)
Micronor refilled

## 2017-05-25 ENCOUNTER — Ambulatory Visit (INDEPENDENT_AMBULATORY_CARE_PROVIDER_SITE_OTHER): Payer: 59 | Admitting: Family Medicine

## 2017-05-25 ENCOUNTER — Encounter: Payer: Self-pay | Admitting: Family Medicine

## 2017-05-25 VITALS — BP 146/61 | HR 85 | Wt 330.0 lb

## 2017-05-25 DIAGNOSIS — E119 Type 2 diabetes mellitus without complications: Secondary | ICD-10-CM

## 2017-05-25 DIAGNOSIS — I10 Essential (primary) hypertension: Secondary | ICD-10-CM | POA: Diagnosis not present

## 2017-05-25 DIAGNOSIS — Z23 Encounter for immunization: Secondary | ICD-10-CM | POA: Diagnosis not present

## 2017-05-25 LAB — POCT GLYCOSYLATED HEMOGLOBIN (HGB A1C): Hemoglobin A1C: 5.8

## 2017-05-25 MED ORDER — LISINOPRIL-HYDROCHLOROTHIAZIDE 20-25 MG PO TABS: 1.0000 | ORAL_TABLET | Freq: Every day | ORAL | 1 refills | Status: DC

## 2017-05-25 MED ORDER — METFORMIN HCL 1000 MG PO TABS
1000.0000 mg | ORAL_TABLET | Freq: Two times a day (BID) | ORAL | 1 refills | Status: DC
Start: 2017-05-25 — End: 2018-02-27

## 2017-05-25 MED ORDER — AMLODIPINE BESYLATE 10 MG PO TABS: 10.0000 mg | ORAL_TABLET | Freq: Every day | ORAL | 1 refills | Status: DC

## 2017-05-25 NOTE — Patient Instructions (Addendum)
Thank you for coming in today. Get fasting labs in the near future.  Work on [place] [employer].  Recheck with me in Late November for well exam.   Recheck sooner if needed.   Continue exercise.   Call or go to the emergency room if you get worse, have trouble breathing, have chest pains, or palpitations.

## 2017-05-25 NOTE — Progress Notes (Signed)
Sheila Gutierrez is a 46 y.o. female who presents to [name]: Primary Care Sports Medicine today for follow-up diabetes, hypertension, morbid obesity.  Diabetes: Patient has well-controlled diabetes. She takes metformin 1000 mg twice daily. She tolerates this medication well. She's been managed to lose some weight recently by adding water aerobic exercise 2-3 times per week. She does not follow a careful diet. She denies any hypoglycemic episodes or hypoglycemic episodes.  Hypertension: Patient notes hypertension that is controlled with amlodipine as well as lisinopril/hydrochlorothiazide. She notes that her blood pressures typically much better controlled at home with blood pressures ranging from 110 to 120s to 130s. She denies significantly elevated blood pressures at home and notes that she gets nervous in doctor's offices.  Morbid obesity: Patient has struggled with morbid obesity most of her life. She's managed to lose over 11 pounds since her last visit by adding exercises noted above. She does not follow a careful diet.   Past Medical History:  Diagnosis Date  . Abnormal mammogram 11/29/2012   Repeat left diagnostic September 2014   . Elevated blood pressure 11/29/2012  . History of bilateral breast reduction surgery 11/29/2012  . Obesity 11/29/2012   No past surgical history on file. Social History  Substance Use Topics  . Smoking status: Never Smoker  . Smokeless tobacco: Never Used  . Alcohol use Yes     Comment: once a month   family history includes Hypertension in her father and mother; Pancreatic cancer in her mother.  ROS as above:  Medications: Current Outpatient Prescriptions  Medication Sig Dispense Refill  . AMBULATORY NON FORMULARY MEDICATION Oral contraceptive (unknown dose/agent[s])    . amLODipine (NORVASC) 10 MG tablet Take 1 tablet (10 mg total) by mouth daily. 90  tablet 1  . lisinopril-hydrochlorothiazide (PRINZIDE,ZESTORETIC) 20-25 MG tablet Take 1 tablet by mouth daily. 90 tablet 1  . metFORMIN (GLUCOPHAGE) 1000 MG tablet Take 1 tablet (1,000 mg total) by mouth 2 (two) times daily with a meal. 180 tablet 1  . norethindrone (MICRONOR,CAMILA,ERRIN) 0.35 MG tablet Take 1 tablet (0.35 mg total) by mouth daily. Need follow up appointment. 14 tablet 0   No current facility-administered medications for this visit.    No Known Allergies  Health Maintenance Health Maintenance  Topic Date Due  . OPHTHALMOLOGY EXAM  12/14/1980  . INFLUENZA VACCINE  03/30/2017  . HEMOGLOBIN A1C  11/22/2017  . FOOT EXAM  05/25/2018  . PNEUMOCOCCAL POLYSACCHARIDE VACCINE (2) 05/19/2021  . PAP SMEAR  07/14/2021  . TETANUS/TDAP  12/23/2022  . HIV Screening  Completed     Exam:  BP (!) 146/61   Pulse 85   Wt (!) 330 lb (149.7 kg)   BMI 46.03 kg/m  Gen: Well NAD Morbidly obese HEENT: EOMI,  MMM Lungs: Normal work of breathing. CTABL Heart: RRR no MRG Abd: NABS, Soft. Nondistended, Nontender Exts: Brisk capillary refill, warm and well perfused.   Diabetic Foot Exam - Simple   Simple Foot Form Diabetic Foot exam was performed with the following findings:  Yes 05/25/2017  9:20 AM  Visual Inspection No deformities, no ulcerations, no other skin breakdown bilaterally:  Yes Sensation Testing Intact to touch and monofilament testing bilaterally:  Yes Pulse Check Posterior Tibialis and Dorsalis pulse intact bilaterally:  Yes Comments      Results for orders placed or performed in visit on 05/25/17 (from the past 72 hour(s))  POCT HgB A1C     Status: None  Collection Time: 05/25/17  9:19 AM  Result Value Ref Range   Hemoglobin A1C 5.8    No results found.    Assessment and Plan: 46 y.o. female with  Diabetes: A1c is excellent today. Plan to continue metformin. Recommend continuing exercise and recommend using a lower carbohydrate diet. Goal for less than  60 g of carbs per meal.  We'll plan on rechecking diabetes every 6 months. Patient will return in late November for wellness visit.  We'll get records from diabetic eye exam performed in February.  Hypertension: Blood pressure is a bit elevated today. I suspect she has white coat hypertension and I think her blood pressure is likely very well controlled at home based on home blood pressure logs. Plan to check fasting labs and continue current regimen.  Morbid obesity: Patient is managed lose a bit of weight since the last visit. This is encouraging. Recommend lower carbohydrate diabetes diet along with continued exercise. This should result in further weight loss. We'll continue to follow at the next visit.  Influenza vaccine given today.   Orders Placed This Encounter  Procedures  . CBC  . COMPLETE METABOLIC PANEL WITH GFR  . Lipid Panel w/reflex Direct LDL  . POCT HgB A1C   Meds ordered this encounter  Medications  . amLODipine (NORVASC) 10 MG tablet    Sig: Take 1 tablet (10 mg total) by mouth daily.    Dispense:  90 tablet    Refill:  1  . lisinopril-hydrochlorothiazide (PRINZIDE,ZESTORETIC) 20-25 MG tablet    Sig: Take 1 tablet by mouth daily.    Dispense:  90 tablet    Refill:  1  . metFORMIN (GLUCOPHAGE) 1000 MG tablet    Sig: Take 1 tablet (1,000 mg total) by mouth 2 (two) times daily with a meal.    Dispense:  180 tablet    Refill:  1     Discussed warning signs or symptoms. Please see discharge instructions. Patient expresses understanding.

## 2017-06-16 ENCOUNTER — Other Ambulatory Visit: Payer: Self-pay | Admitting: Family Medicine

## 2017-06-21 ENCOUNTER — Other Ambulatory Visit: Payer: Self-pay | Admitting: Family Medicine

## 2017-07-12 ENCOUNTER — Other Ambulatory Visit: Payer: Self-pay | Admitting: Family Medicine

## 2017-07-12 DIAGNOSIS — Z1231 Encounter for screening mammogram for malignant neoplasm of breast: Secondary | ICD-10-CM

## 2017-07-20 LAB — COMPLETE METABOLIC PANEL WITH GFR
AG RATIO: 1.4 (calc) (ref 1.0–2.5)
ALKALINE PHOSPHATASE (APISO): 64 U/L (ref 33–115)
ALT: 65 U/L — AB (ref 6–29)
AST: 31 U/L (ref 10–35)
Albumin: 4 g/dL (ref 3.6–5.1)
BILIRUBIN TOTAL: 0.4 mg/dL (ref 0.2–1.2)
BUN: 10 mg/dL (ref 7–25)
CO2: 27 mmol/L (ref 20–32)
Calcium: 9.4 mg/dL (ref 8.6–10.2)
Chloride: 102 mmol/L (ref 98–110)
Creat: 0.54 mg/dL (ref 0.50–1.10)
GFR, Est African American: 131 mL/min/{1.73_m2} (ref >=60)
GFR, Est Non African American: 113 mL/min/{1.73_m2} (ref >=60)
GLUCOSE: 99 mg/dL (ref 65–99)
Globulin: 2.9 g/dL (calc) (ref 1.9–3.7)
POTASSIUM: 4 mmol/L (ref 3.5–5.3)
Sodium: 138 mmol/L (ref 135–146)
TOTAL PROTEIN: 6.9 g/dL (ref 6.1–8.1)

## 2017-07-20 LAB — CBC
HCT: 37.3 % (ref 35.0–45.0)
Hemoglobin: 12 g/dL (ref 11.7–15.5)
MCH: 26.8 pg — AB (ref 27.0–33.0)
MCHC: 32.2 g/dL (ref 32.0–36.0)
MCV: 83.3 fL (ref 80.0–100.0)
MPV: 11.4 fL (ref 7.5–12.5)
PLATELETS: 259 10*3/uL (ref 140–400)
RBC: 4.48 10*6/uL (ref 3.80–5.10)
RDW: 14.4 % (ref 11.0–15.0)
WBC: 10.2 10*3/uL (ref 3.8–10.8)

## 2017-07-20 LAB — LIPID PANEL W/REFLEX DIRECT LDL
CHOLESTEROL: 151 mg/dL (ref <200)
HDL: 65 mg/dL (ref >50)
LDL CHOLESTEROL (CALC): 72 mg/dL
Non-HDL Cholesterol (Calc): 86 mg/dL (calc) (ref <130)
TRIGLYCERIDES: 65 mg/dL (ref <150)
Total CHOL/HDL Ratio: 2.3 (calc) (ref <5.0)

## 2017-07-23 ENCOUNTER — Other Ambulatory Visit: Payer: Self-pay | Admitting: Family Medicine

## 2017-07-27 ENCOUNTER — Ambulatory Visit (INDEPENDENT_AMBULATORY_CARE_PROVIDER_SITE_OTHER): Payer: 59

## 2017-07-27 ENCOUNTER — Ambulatory Visit (INDEPENDENT_AMBULATORY_CARE_PROVIDER_SITE_OTHER): Payer: 59 | Admitting: Family Medicine

## 2017-07-27 ENCOUNTER — Encounter: Payer: Self-pay | Admitting: Family Medicine

## 2017-07-27 VITALS — BP 142/80 | HR 87 | Ht 71.0 in | Wt 324.0 lb

## 2017-07-27 DIAGNOSIS — R740 Nonspecific elevation of levels of transaminase and lactic acid dehydrogenase [LDH]: Secondary | ICD-10-CM | POA: Diagnosis not present

## 2017-07-27 DIAGNOSIS — R928 Other abnormal and inconclusive findings on diagnostic imaging of breast: Secondary | ICD-10-CM | POA: Diagnosis not present

## 2017-07-27 DIAGNOSIS — I10 Essential (primary) hypertension: Secondary | ICD-10-CM | POA: Diagnosis not present

## 2017-07-27 DIAGNOSIS — E119 Type 2 diabetes mellitus without complications: Secondary | ICD-10-CM | POA: Diagnosis not present

## 2017-07-27 DIAGNOSIS — Z1231 Encounter for screening mammogram for malignant neoplasm of breast: Secondary | ICD-10-CM

## 2017-07-27 DIAGNOSIS — Z0000 Encounter for general adult medical examination without abnormal findings: Secondary | ICD-10-CM

## 2017-07-27 DIAGNOSIS — R7401 Elevation of levels of liver transaminase levels: Secondary | ICD-10-CM

## 2017-07-27 MED ORDER — NORETHINDRONE 0.35 MG PO TABS: 1.0000 | ORAL_TABLET | Freq: Every day | ORAL | 3 refills | Status: DC

## 2017-07-27 NOTE — Progress Notes (Signed)
Sheila Gutierrez is a 46 y.o. female who [facility] today for well adult visit.  Sheila Gutierrez is doing well overall.  She is trying to lose weight if possible by exercising.  She started a water aerobics program which she enjoys.  She is not following a careful diet.  She feels well overall with no fevers or chills.  She does note occasional episodes of a red mildly painful mildly itchy bump on her skin.  This occurs sporadically and scattered about her body.  It last several days.  She is tried some over-the-counter topical treatments which have not helped much.  She denies any symptoms of detergents or shampoos.   Past Medical History:  Diagnosis Date  . Abnormal mammogram 11/29/2012   Repeat left diagnostic September 2014   . Elevated blood pressure 11/29/2012  . History of bilateral breast reduction surgery 11/29/2012  . Obesity 11/29/2012   No past surgical history on file. Social History   Tobacco Use  . Smoking status: Never Smoker  . Smokeless tobacco: Never Used  Substance Use Topics  . Alcohol use: Yes    Comment: once a month   family history includes Hypertension in her father and mother; Pancreatic cancer in her mother.  ROS as above:  Medications: Current Outpatient Medications  Medication Sig Dispense Refill  . AMBULATORY NON FORMULARY MEDICATION Oral contraceptive (unknown dose/agent[s])    . amLODipine (NORVASC) 10 MG tablet Take 1 tablet (10 mg total) by mouth daily. 90 tablet 1  . lisinopril-hydrochlorothiazide (PRINZIDE,ZESTORETIC) 20-25 MG tablet Take 1 tablet by mouth daily. 90 tablet 1  . metFORMIN (GLUCOPHAGE) 1000 MG tablet Take 1 tablet (1,000 mg total) by mouth 2 (two) times daily with a meal. 180 tablet 1  . norethindrone (MICRONOR,CAMILA,ERRIN) 0.35 MG tablet Take 1 tablet (0.35 mg total) by mouth daily. 90 tablet 3   No current  facility-administered medications for this visit.    No Known Allergies  Health Maintenance Health Maintenance  Topic Date Due  . OPHTHALMOLOGY EXAM  12/14/1980  . HEMOGLOBIN A1C  11/22/2017  . FOOT EXAM  05/25/2018  . PNEUMOCOCCAL POLYSACCHARIDE VACCINE (2) 05/19/2021  . PAP SMEAR  07/14/2021  . TETANUS/TDAP  12/23/2022  . INFLUENZA VACCINE  Completed  . HIV Screening  Completed     Exam:  BP (!) 142/80   Pulse 87   Ht 5\' 11"  (1.803 m)   Wt (!) 324 lb (147 kg)   BMI 45.19 kg/m   Wt Readings from Last 10 Encounters:  07/27/17 (!) 324 lb (147 kg)  05/25/17 (!) 330 lb (149.7 kg)  07/14/16 (!) 341 lb (154.7 kg)  05/19/16 (!) 343 lb (155.6 kg)  09/03/15 (!) 335 lb (152 kg)  08/06/15 (!) 335 lb (152 kg)  07/09/15 (!) 334 lb (151.5 kg)  06/11/15 (!) 339 lb (153.8 kg)  05/14/15 (!) 354 lb (160.6 kg)  08/05/14 (!) 345 lb (156.5 kg)    Gen: Well NAD HEENT: EOMI,  MMM Lungs: Normal work of breathing. CTABL Heart: RRR no MRG Abd: NABS, Soft. Nondistended, Nontender Exts: Brisk capillary refill, warm and well perfused.  Skin no obvious lesions today.  Lab Results  Component Value Date   CHOL 151 07/20/2017   HDL 65 07/20/2017   LDLCALC 60 05/19/2016   TRIG 65 07/20/2017   CHOLHDL 2.3 07/20/2017     Chemistry      Component Value Date/Time  NA 138 07/20/2017 0751   K 4.0 07/20/2017 0751   CL 102 07/20/2017 0751   CO2 27 07/20/2017 0751   BUN 10 07/20/2017 0751   CREATININE 0.54 07/20/2017 0751      Component Value Date/Time   CALCIUM 9.4 07/20/2017 0751   ALKPHOS 58 05/19/2016 0946   AST 31 07/20/2017 0751   ALT 65 (H) 07/20/2017 0751   BILITOT 0.4 07/20/2017 0751     Lab Results  Component Value Date   HGBA1C 5.8 05/25/2017      Assessment and Plan: 46 y.o. female with  Well adult doing reasonably well.  Patient continues to suffer from obesity which is a central medical problem.  We discussed strategies including reduced carbohydrate diet her  calorie counting.  We will recheck this in the near future.  Labs were done recently which showed mild transaminitis will recheck metabolic panel and A1c in about 3 months.  Think the rash is likely folliculitis versus hives.  Plan to start cetirizine daily.  Will also use chlorhexidine body wash and recheck in a few months.  No orders of the defined types were placed in this encounter.  Meds ordered this encounter  Medications  . norethindrone (MICRONOR,CAMILA,ERRIN) 0.35 MG tablet    Sig: Take 1 tablet (0.35 mg total) by mouth daily.    Dispense:  90 tablet    Refill:  3     Discussed warning signs or symptoms. Please see discharge instructions. Patient expresses understanding.

## 2017-07-27 NOTE — Patient Instructions (Addendum)
Thank you for coming in today. Continue to exercise.  Work on a lower carb diet and fewer calories.  Continue current medicines.  We will recheck liver labs and diabetes A1c in 3 months.  You can ask for labs ahead of time.  Try to get an eye exam in the near future.  Take zyrtec daily for 1 month.  Use HIbiclens daily in the shower and then every few days.  Recheck in 3 months.    Folliculitis Folliculitis is inflammation of the hair follicles. Folliculitis most commonly occurs on the scalp, thighs, legs, back, and buttocks. However, it can occur anywhere on the body. What are the causes? This condition may be caused by:  A bacterial infection (common).  A fungal infection.  A viral infection.  Coming into contact with certain chemicals, especially oils and tars.  Shaving or waxing.  Applying greasy ointments or creams to your skin often.  Long-lasting folliculitis and folliculitis that keeps coming back can be caused by bacteria that live in the nostrils. What increases the risk? This condition is more likely to develop in people with:  A weakened immune system.  Diabetes.  Obesity.  What are the signs or symptoms? Symptoms of this condition include:  Redness.  Soreness.  Swelling.  Itching.  Small white or yellow, pus-filled, itchy spots (pustules) that appear over a reddened area. If there is an infection that goes deep into the follicle, these may develop into a boil (furuncle).  A group of closely packed boils (carbuncle). These tend to form in hairy, sweaty areas of the body.  How is this diagnosed? This condition is diagnosed with a skin exam. To find what is causing the condition, your health care provider may take a sample of one of the pustules or boils for testing. How is this treated? This condition may be treated by:  Applying warm compresses to the affected areas.  Taking an antibiotic medicine or applying an antibiotic medicine to the  skin.  Applying or bathing with an antiseptic solution.  Taking an over-the-counter medicine to help with itching.  Having a procedure to drain any pustules or boils. This may be done if a pustule or boil contains a lot of pus or fluid.  Laser hair removal. This may be done to treat long-lasting folliculitis.  Follow these instructions at home:  If directed, apply heat to the affected area as often as told by your health care provider. Use the heat source that your health care provider recommends, such as a moist heat pack or a heating pad. ? Place a towel between your skin and the heat source. ? Leave the heat on for 20-30 minutes. ? Remove the heat if your skin turns bright red. This is especially important if you are unable to feel pain, heat, or cold. You may have a greater risk of getting burned.  If you were prescribed an antibiotic medicine, use it as told by your health care provider. Do not stop using the antibiotic even if you start to feel better.  Take over-the-counter and prescription medicines only as told by your health care provider.  Do not shave irritated skin.  Keep all follow-up visits as told by your health care provider. This is important. Get help right away if:  You have more redness, swelling, or pain in the affected area.  Red streaks are spreading from the affected area.  You have a fever. This information is not intended to replace advice given to you by  your health care provider. Make sure you discuss any questions you have with your health care provider. Document Released: 10/25/2001 Document Revised: 03/05/2016 Document Reviewed: 06/06/2015 Elsevier Interactive Patient Education  2018 Elsevier Inc.   Fatty Liver Fatty liver, also called hepatic steatosis or steatohepatitis, is a condition in which too much fat has built up in your liver cells. The liver removes harmful substances from your bloodstream. It produces fluids your body needs. It also  helps your body use and store energy from the food you eat. In many cases, fatty liver does not cause symptoms or problems. It is often diagnosed when tests are being done for other reasons. However, over time, fatty liver can cause inflammation that may lead to more serious liver problems, such as scarring of the liver (cirrhosis). What are the causes? Causes of fatty liver may include:  Drinking too much alcohol.  Poor nutrition.  Obesity.  Cushing syndrome.  Diabetes.  Hyperlipidemia.  Pregnancy.  Certain drugs.  Poisons.  Some viral infections.  What increases the risk? You may be more likely to develop fatty liver if you:  Abuse alcohol.  Are pregnant.  Are overweight.  Have diabetes.  Have hepatitis.  Have a high triglyceride level.  What are the signs or symptoms? Fatty liver often does not cause any symptoms. In cases where symptoms develop, they can include:  Fatigue.  Weakness.  Weight loss.  Confusion.  Abdominal pain.  Yellowing of your skin and the white parts of your eyes (jaundice).  Nausea and vomiting.  How is this diagnosed? Fatty liver may be diagnosed by:  Physical exam and medical history.  Blood tests.  Imaging tests, such as an ultrasound, CT scan, or MRI.  Liver biopsy. A small sample of liver tissue is removed using a needle. The sample is then looked at under a microscope.  How is this treated? Fatty liver is often caused by other health conditions. Treatment for fatty liver may involve medicines and lifestyle changes to manage conditions such as:  Alcoholism.  High cholesterol.  Diabetes.  Being overweight or obese.  Follow these instructions at home:  Eat a healthy diet as directed by your health care provider.  Exercise regularly. This can help you lose weight and control your cholesterol and diabetes. Talk to your health care provider about an exercise plan and which activities are best for you.  Do not  drink alcohol.  Take medicines only as directed by your health care provider. Contact a health care provider if: You have difficulty controlling your:  Blood sugar.  Cholesterol.  Alcohol consumption.  Get help right away if:  You have abdominal pain.  You have jaundice.  You have nausea and vomiting. This information is not intended to replace advice given to you by your health care provider. Make sure you discuss any questions you have with your health care provider. Document Released: 10/01/2005 Document Revised: 01/22/2016 Document Reviewed: 12/26/2013 Elsevier Interactive Patient Education  [employer]2018 Elsevier Inc.

## 2017-07-28 ENCOUNTER — Other Ambulatory Visit: Payer: Self-pay | Admitting: Family Medicine

## 2017-07-28 DIAGNOSIS — R928 Other abnormal and inconclusive findings on diagnostic imaging of breast: Secondary | ICD-10-CM

## 2017-08-03 ENCOUNTER — Other Ambulatory Visit: Payer: Self-pay | Admitting: Family Medicine

## 2017-08-03 ENCOUNTER — Ambulatory Visit
Admission: RE | Admit: 2017-08-03 | Discharge: 2017-08-03 | Disposition: A | Discharge status: Home / Self Care | Payer: 59 | Source: Ambulatory Visit | Attending: Family Medicine | Admitting: Family Medicine

## 2017-08-03 DIAGNOSIS — R921 Mammographic calcification found on diagnostic imaging of breast: Secondary | ICD-10-CM

## 2017-08-03 DIAGNOSIS — R928 Other abnormal and inconclusive findings on diagnostic imaging of breast: Secondary | ICD-10-CM

## 2017-08-31 LAB — HM DIABETES EYE EXAM

## 2017-11-29 ENCOUNTER — Other Ambulatory Visit: Payer: Self-pay | Admitting: Family Medicine

## 2017-12-22 ENCOUNTER — Other Ambulatory Visit: Payer: Self-pay | Admitting: Family Medicine

## 2018-01-16 ENCOUNTER — Other Ambulatory Visit: Payer: Self-pay | Admitting: Family Medicine

## 2018-01-30 ENCOUNTER — Other Ambulatory Visit: Payer: Self-pay | Admitting: Family Medicine

## 2018-01-30 DIAGNOSIS — I10 Essential (primary) hypertension: Secondary | ICD-10-CM

## 2018-02-27 ENCOUNTER — Ambulatory Visit (INDEPENDENT_AMBULATORY_CARE_PROVIDER_SITE_OTHER): Payer: 59 | Admitting: Family Medicine

## 2018-02-27 ENCOUNTER — Encounter: Payer: Self-pay | Admitting: Family Medicine

## 2018-02-27 VITALS — BP 164/81 | HR 92 | Ht 71.0 in | Wt 316.0 lb

## 2018-02-27 DIAGNOSIS — I10 Essential (primary) hypertension: Secondary | ICD-10-CM

## 2018-02-27 DIAGNOSIS — E119 Type 2 diabetes mellitus without complications: Secondary | ICD-10-CM | POA: Diagnosis not present

## 2018-02-27 DIAGNOSIS — T783XXA Angioneurotic edema, initial encounter: Secondary | ICD-10-CM

## 2018-02-27 DIAGNOSIS — T464X5A Adverse effect of angiotensin-converting-enzyme inhibitors, initial encounter: Secondary | ICD-10-CM

## 2018-02-27 HISTORY — DX: Adverse effect of angiotensin-converting-enzyme inhibitors, initial encounter: T46.4X5A

## 2018-02-27 HISTORY — DX: Angioneurotic edema, initial encounter: T78.3XXA

## 2018-02-27 LAB — POCT GLYCOSYLATED HEMOGLOBIN (HGB A1C): HEMOGLOBIN A1C: 5.7 % — AB (ref 4.0–5.6)

## 2018-02-27 MED ORDER — METFORMIN HCL 1000 MG PO TABS: 1000.0000 mg | ORAL_TABLET | Freq: Two times a day (BID) | ORAL | 1 refills | Status: DC

## 2018-02-27 MED ORDER — CHLORTHALIDONE 25 MG PO TABS: 25.0000 mg | ORAL_TABLET | Freq: Every day | ORAL | 0 refills | Status: DC

## 2018-02-27 MED ORDER — AMLODIPINE BESYLATE 10 MG PO TABS: 10.0000 mg | ORAL_TABLET | Freq: Every day | ORAL | 0 refills | Status: DC

## 2018-02-27 MED ORDER — LOSARTAN POTASSIUM 100 MG PO TABS: 100.0000 mg | ORAL_TABLET | Freq: Every day | ORAL | 0 refills | Status: DC

## 2018-02-27 NOTE — Patient Instructions (Addendum)
Thank you for coming in today. STOP lisinopril/HCTZ Restart Amlodipine Start Chlorthalidone  Start losartan in a few days.  Recheck with me in 1 month to follow up angioedema and BP Take zyrtrec (certizine) twice daily for 1 week or until swelling goes away.

## 2018-02-27 NOTE — Progress Notes (Signed)
Sheila Gutierrez is a 47 y.o. female who presents to [facility]: Primary Care Sports Medicine today for angioedema.  Sheila Gutierrez was seen in urgent care yesterday for lip swelling thought to be angioedema related to lisinopril.  She takes amlodipine and lisinopril/hydrochlorothiazide daily for hypertension.  She notes over the last week she is run out of her amlodipine.  She developed upper lip swelling yesterday.  She denies any tongue swelling or difficulty breathing.  She was recommended to take Benadryl which she has done.  She thinks the lip swelling is improving.  She feels pretty well with no fevers chills nausea vomiting or diarrhea.  Additionally Sheila Gutierrez has diabetes that has been extremely well controlled with metformin listed below as well as improve diet.  She is eating a lower carbohydrate diet.  She denies any significant hypoglycemic episodes polyuria or polydipsia.  She notes that she is lost some weight with her improved diet and increased exercise.   ROS as above:  Exam:  BP (!) 164/81   Pulse 92   Ht 5\' 11"  (1.803 m)   Wt (!) 316 lb (143.3 kg)   BMI 44.07 kg/m   Wt Readings from Last 5 Encounters:  02/27/18 (!) 316 lb (143.3 kg)  07/27/17 (!) 324 lb (147 kg)  05/25/17 (!) 330 lb (149.7 kg)  07/14/16 (!) 341 lb (154.7 kg)  05/19/16 (!) 343 lb (155.6 kg)    Gen: Well NAD HEENT: EOMI,  MMM minimal upper lip swelling.  No tongue swelling.  No neck masses. Lungs: Normal work of breathing. CTABL Heart: RRR no MRG Abd: NABS, Soft. Nondistended, Nontender Exts: Brisk capillary refill, warm and well perfused.   Lab and Radiology Results Results for orders placed or performed in visit on 02/27/18 (from the past 72 hour(s))  POCT HgB A1C     Status: Abnormal   Collection Time: 02/27/18 10:34 AM  Result Value Ref Range   Hemoglobin A1C 5.7 (A) 4.0 - 5.6 %   HbA1c POC (<> result,  manual entry)  4.0 - 5.6 %   HbA1c, POC (prediabetic range)  5.7 - 6.4 %   HbA1c, POC (controlled diabetic range)  0.0 - 7.0 %   No results found.    Assessment and Plan: 47 y.o. 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  Plan to discontinue hydrochlorothiazide and lisinopril.  Plan to treat angioedema with twice daily cetirizine for a few days.  Recheck in 1 month.  Return sooner if needed.  Hypertension: Not controlled today because patient is not on any medications.  Plan to resume amlodipine.  Plan to switch to isolated chlorthalidone as this likely is going to be more effective than hydrochlorothiazide for blood pressure control.  Additionally will after a few days start losartan.  Angiotensin receptor blockers in the setting of mild angioedema due to ACE inhibitors are shown to be safe.  Plan to recheck blood pressure in about a month.  At that time we will likely check metabolic panel as well to assess renal function and electrolytes.  Diabetes: Well-controlled diabetes.  Plan to continue low carbohydrate diet and metformin.  Will obtain records from recent diabetic eye exam.  Morbid obesity: Significant improvement.  Plan to continue lifestyle change.  Recheck in 1 month.   Orders Placed This Encounter  Procedures  . POCT HgB A1C   Meds ordered this encounter  Medications  . amLODipine (NORVASC) 10 MG tablet  Sig: Take 1 tablet (10 mg total) by mouth daily.    Dispense:  90 tablet    Refill:  0  . chlorthalidone (HYGROTON) 25 MG tablet    Sig: Take 1 tablet (25 mg total) by mouth daily.    Dispense:  90 tablet    Refill:  0  . losartan (COZAAR) 100 MG tablet    Sig: Take 1 tablet (100 mg total) by mouth daily.    Dispense:  90 tablet    Refill:  0    Replaces lisinopril  . metFORMIN (GLUCOPHAGE) 1000 MG tablet    Sig: Take 1 tablet (1,000 mg total) by mouth 2 (two) times daily with a meal.    Dispense:  180 tablet    Refill:  1     Historical  information moved to improve visibility of documentation.  Past Medical History:  Diagnosis Date  . Abnormal mammogram 11/29/2012   Repeat left diagnostic September 2014   . 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 Elevated blood pressure 11/29/2012  . History of bilateral breast reduction surgery 11/29/2012  . Obesity 11/29/2012   Past Surgical History:  Procedure Laterality Date  . REDUCTION MAMMAPLASTY Bilateral 2010   Social History   Tobacco Use  . Smoking status: Never Smoker  . Smokeless tobacco: Never Used  Substance Use Topics  . Alcohol use: Yes    Comment: once a month   family history includes Hypertension in her father and mother; Pancreatic cancer in her mother.  Medications: Current Outpatient Medications  Medication Sig Dispense Refill  . AMBULATORY NON FORMULARY MEDICATION Oral contraceptive (unknown dose/agent[s])    . amLODipine (NORVASC) 10 MG tablet Take 1 tablet (10 mg total) by mouth daily. 90 tablet 0  . metFORMIN (GLUCOPHAGE) 1000 MG tablet Take 1 tablet (1,000 mg total) by mouth 2 (two) times daily with a meal. 180 tablet 1  . norethindrone (MICRONOR,CAMILA,ERRIN) 0.35 MG tablet Take 1 tablet (0.35 mg total) by mouth daily. 90 tablet 3  . chlorthalidone (HYGROTON) 25 MG tablet Take 1 tablet (25 mg total) by mouth daily. 90 tablet 0  . losartan (COZAAR) 100 MG tablet Take 1 tablet (100 mg total) by mouth daily. 90 tablet 0   No current facility-administered medications for this visit.    Allergies  Allergen Reactions  . Lisinopril Swelling    Lip angioedema     Discussed warning signs or symptoms. Please see discharge instructions. Patient expresses understanding.

## 2018-03-08 ENCOUNTER — Encounter: Payer: Self-pay | Admitting: Family Medicine

## 2018-05-22 ENCOUNTER — Other Ambulatory Visit: Payer: Self-pay | Admitting: Family Medicine

## 2018-05-30 ENCOUNTER — Ambulatory Visit: Payer: 59 | Admitting: Family Medicine

## 2018-05-31 ENCOUNTER — Ambulatory Visit: Payer: 59 | Admitting: Family Medicine

## 2018-06-14 ENCOUNTER — Ambulatory Visit (INDEPENDENT_AMBULATORY_CARE_PROVIDER_SITE_OTHER): Payer: 59 | Admitting: Family Medicine

## 2018-06-14 ENCOUNTER — Encounter: Payer: Self-pay | Admitting: Family Medicine

## 2018-06-14 ENCOUNTER — Ambulatory Visit (INDEPENDENT_AMBULATORY_CARE_PROVIDER_SITE_OTHER): Payer: 59

## 2018-06-14 VITALS — BP 136/74 | HR 97 | Ht 71.0 in | Wt 331.0 lb

## 2018-06-14 DIAGNOSIS — E119 Type 2 diabetes mellitus without complications: Secondary | ICD-10-CM | POA: Diagnosis not present

## 2018-06-14 DIAGNOSIS — R740 Nonspecific elevation of levels of transaminase and lactic acid dehydrogenase [LDH]: Secondary | ICD-10-CM

## 2018-06-14 DIAGNOSIS — Z23 Encounter for immunization: Secondary | ICD-10-CM | POA: Diagnosis not present

## 2018-06-14 DIAGNOSIS — M5136 Other intervertebral disc degeneration, lumbar region: Secondary | ICD-10-CM | POA: Diagnosis not present

## 2018-06-14 DIAGNOSIS — M545 Low back pain, unspecified: Secondary | ICD-10-CM

## 2018-06-14 DIAGNOSIS — M62838 Other muscle spasm: Secondary | ICD-10-CM

## 2018-06-14 DIAGNOSIS — I10 Essential (primary) hypertension: Secondary | ICD-10-CM

## 2018-06-14 DIAGNOSIS — R7401 Elevation of levels of liver transaminase levels: Secondary | ICD-10-CM

## 2018-06-14 LAB — LIPID PANEL W/REFLEX DIRECT LDL
Cholesterol: 154 mg/dL (ref <200)
HDL: 56 mg/dL (ref >50)
LDL Cholesterol (Calc): 83 mg/dL (calc)
Non-HDL Cholesterol (Calc): 98 mg/dL (calc) (ref <130)
Total CHOL/HDL Ratio: 2.8 (calc) (ref <5.0)
Triglycerides: 72 mg/dL (ref <150)

## 2018-06-14 LAB — COMPLETE METABOLIC PANEL WITH GFR
AG RATIO: 1.3 (calc) (ref 1.0–2.5)
ALBUMIN MSPROF: 4 g/dL (ref 3.6–5.1)
ALT: 29 U/L (ref 6–29)
AST: 16 U/L (ref 10–35)
Alkaline phosphatase (APISO): 54 U/L (ref 33–115)
BILIRUBIN TOTAL: 0.5 mg/dL (ref 0.2–1.2)
BUN: 21 mg/dL (ref 7–25)
CO2: 25 mmol/L (ref 20–32)
Calcium: 9.3 mg/dL (ref 8.6–10.2)
Chloride: 102 mmol/L (ref 98–110)
Creat: 0.73 mg/dL (ref 0.50–1.10)
GFR, EST AFRICAN AMERICAN: 114 mL/min/{1.73_m2} (ref >=60)
GFR, Est Non African American: 98 mL/min/{1.73_m2} (ref >=60)
Globulin: 3 g/dL (calc) (ref 1.9–3.7)
Glucose, Bld: 87 mg/dL (ref 65–99)
POTASSIUM: 3.9 mmol/L (ref 3.5–5.3)
SODIUM: 138 mmol/L (ref 135–146)
TOTAL PROTEIN: 7 g/dL (ref 6.1–8.1)

## 2018-06-14 LAB — CBC
HEMATOCRIT: 35.8 % (ref 35.0–45.0)
Hemoglobin: 11.8 g/dL (ref 11.7–15.5)
MCH: 27.5 pg (ref 27.0–33.0)
MCHC: 33 g/dL (ref 32.0–36.0)
MCV: 83.4 fL (ref 80.0–100.0)
MPV: 10.5 fL (ref 7.5–12.5)
PLATELETS: 340 10*3/uL (ref 140–400)
RBC: 4.29 10*6/uL (ref 3.80–5.10)
RDW: 14 % (ref 11.0–15.0)
WBC: 11.6 10*3/uL — ABNORMAL HIGH (ref 3.8–10.8)

## 2018-06-14 LAB — POCT GLYCOSYLATED HEMOGLOBIN (HGB A1C): Hemoglobin A1C: 6 % — AB (ref 4.0–5.6)

## 2018-06-14 MED ORDER — AMLODIPINE BESYLATE 10 MG PO TABS: 10.0000 mg | ORAL_TABLET | Freq: Every day | ORAL | 1 refills | Status: DC

## 2018-06-14 MED ORDER — LOSARTAN POTASSIUM 100 MG PO TABS: 100.0000 mg | ORAL_TABLET | Freq: Every day | ORAL | 1 refills | Status: DC

## 2018-06-14 MED ORDER — CHLORTHALIDONE 25 MG PO TABS: 25.0000 mg | ORAL_TABLET | Freq: Every day | ORAL | 1 refills | Status: DC

## 2018-06-14 NOTE — Progress Notes (Signed)
Sheila Gutierrez is a 47 y.o. female who presents to [name]: Primary Care Sports Medicine today for follow-up hypertension diabetes angioedema and discuss left low back pain and left trapezius pain.  Reyah was last seen about 3 months ago.  At that visit she had recently been diagnosed with angioedema thought to be due to her an ACE inhibitor.  She was switched to amlodipine, chlorthalidone, and losartan.  She takes his medications and tolerates them well with no chest pain palpitations shortness of breath lightheadedness or dizziness.  Additionally she denies any significant swelling or angioedema-like symptoms.  She has a history of diabetes and currently takes metformin.  She tolerates it well and feels well with no hyperglycemic or hypoglycemic episodes.  She notes that she is been having left low back pain now for about 3 months without injury.  She denies any significant radiating pain weakness or numbness.  She denies any bowel or bladder dysfunction.  She notes the pain is worse when she stands from a seated position and better with rest.  She has not had much treatment yet.  Additionally she notes left trapezius pain onset now for a week or 2.  She denies any injury.  No radiating pain weakness or numbness   ROS as above:  Exam:  BP 136/74   Pulse 97   Ht 5\' 11"  (1.803 m)   Wt (!) 331 lb (150.1 kg)   BMI 46.17 kg/m  Wt Readings from Last 5 Encounters:  06/14/18 (!) 331 lb (150.1 kg)  02/27/18 (!) 316 lb (143.3 kg)  07/27/17 (!) 324 lb (147 kg)  05/25/17 (!) 330 lb (149.7 kg)  07/14/16 (!) 341 lb (154.7 kg)    Gen: Well NAD HEENT: EOMI,  MMM Lungs: Normal work of breathing. CTABL Heart: RRR no MRG Abd: NABS, Soft. Nondistended, Nontender Exts: Brisk capillary refill, warm and well perfused.  MSK:  C-spine nontender to spinal midline.  Tender palpation left  trapezius. Normal spinal neck motion.  Upper extremity strength reflexes and sensation are equal and normal throughout. L-spine: Nontender to spinal midline.  Tender palpation left lumbar paraspinal musculature. Normal lumbar motion. Lower extreme strength reflexes and sensation are equal normal throughout  Lab and Radiology Results X-ray L-spine images personally independently reviewed. Scoliosis changes in lumbar spine. Significant degenerative changes at L2-3, L 3-4 No acute changes.  Await formal radiology review.  Lab Results  Component Value Date   HGBA1C 6.0 (A) 06/14/2018      Assessment and Plan: 47 y.o. female with  Low back pain: Likely myofascial changes but some degenerative changes also present on x-ray.  Plan for trial of physical therapy.  If not better recheck in about a month or so.  Next step may be MRI for injection planning.  Additionally patient has trapezius pain likely myofascial strain again physical therapy should be helpful here.  Diabetes is well controlled.  Continue current regimen.  Check in 3 months.  Hypertension: Doing well blood pressure reasonably well-controlled continue current regimen.  Check basic fasting labs listed below.  Flu vaccine given today prior to discharge.   Orders Placed This Encounter  Procedures  . DG Lumbar Spine Complete    Standing Status:   Future    Number of Occurrences:   1    Standing Expiration Date:   08/15/2019    Order Specific Question:   Reason for Exam (SYMPTOM  OR DIAGNOSIS REQUIRED)    Answer:  eval left lumbosacral pain x 3 months    Order Specific Question:   Is patient pregnant?    Answer:   No    Order Specific Question:   Preferred imaging location?    Answer:   [facility]    Order Specific Question:   Radiology Contrast Protocol - do NOT remove file path    Answer:   [email or web address].[email or web address]  . Flu Vaccine QUAD 36+ mos IM  . CBC  . COMPLETE  METABOLIC PANEL WITH GFR  . Lipid Panel w/reflex Direct LDL  . Ambulatory referral to Physical Therapy    Referral Priority:   Routine    Referral Type:   Physical Medicine    Referral Reason:   Specialty Services Required    Requested Specialty:   Physical Therapy  . POCT HgB A1C   Meds ordered this encounter  Medications  . amLODipine (NORVASC) 10 MG tablet    Sig: Take 1 tablet (10 mg total) by mouth daily.    Dispense:  90 tablet    Refill:  1  . chlorthalidone (HYGROTON) 25 MG tablet    Sig: Take 1 tablet (25 mg total) by mouth daily.    Dispense:  90 tablet    Refill:  1  . losartan (COZAAR) 100 MG tablet    Sig: Take 1 tablet (100 mg total) by mouth daily.    Dispense:  90 tablet    Refill:  1     Historical information moved to improve visibility of documentation.  Past Medical History:  Diagnosis Date  . Abnormal mammogram 11/29/2012   Repeat left diagnostic September 2014   . 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 Elevated blood pressure 11/29/2012  . History of bilateral breast reduction surgery 11/29/2012  . Obesity 11/29/2012   Past Surgical History:  Procedure Laterality Date  . REDUCTION MAMMAPLASTY Bilateral 2010   Social History   Tobacco Use  . Smoking status: Never Smoker  . Smokeless tobacco: Never Used  Substance Use Topics  . Alcohol use: Yes    Comment: once a month   family history includes Hypertension in her father and mother; Pancreatic cancer in her mother.  Medications: Current Outpatient Medications  Medication Sig Dispense Refill  . AMBULATORY NON FORMULARY MEDICATION Oral contraceptive (unknown dose/agent[s])    . amLODipine (NORVASC) 10 MG tablet Take 1 tablet (10 mg total) by mouth daily. 90 tablet 1  . chlorthalidone (HYGROTON) 25 MG tablet Take 1 tablet (25 mg total) by mouth daily. 90 tablet 1  . losartan (COZAAR) 100 MG tablet Take 1 tablet (100 mg total) by mouth daily. 90 tablet 1  .  metFORMIN (GLUCOPHAGE) 1000 MG tablet Take 1 tablet (1,000 mg total) by mouth 2 (two) times daily with a meal. 180 tablet 1  . norethindrone (MICRONOR,CAMILA,ERRIN) 0.35 MG tablet Take 1 tablet (0.35 mg total) by mouth daily. 90 tablet 3   No current facility-administered medications for this visit.    Allergies  Allergen Reactions  . Lisinopril Swelling    Lip angioedema     Discussed warning signs or symptoms. Please see discharge instructions. Patient expresses understanding.

## 2018-06-14 NOTE — Patient Instructions (Signed)
Thank you for coming in today. Continue current medicine.  Get xray and labs now.  Attend Physical therapy.  Recheck in 3 moths in December as scheduled for well exam.  Return sooner if needed if back and neck pain not better.    Lumbosacral Strain Lumbosacral strain is an injury that causes pain in the lower back (lumbosacral spine). This injury usually occurs from overstretching the muscles or ligaments along your spine. A strain can affect one or more muscles or cord-like tissues that connect bones to other bones (ligaments). What are the causes? This condition may be caused by:  A hard, direct hit (blow) to the back.  Excessive stretching of the lower back muscles. This may result from: ? A fall. ? Lifting something heavy. ? Repetitive movements such as bending or crouching.  What increases the risk? The following factors may increase your risk of getting this condition:  Participating in sports or activities that involve: ? A sudden twist of the back. ? Pushing or pulling motions.  Being overweight or obese.  Having poor strength and flexibility, especially tight hamstrings or weak muscles in the back or abdomen.  Having too much of a curve in the lower back.  Having a pelvis that is tilted forward.  What are the signs or symptoms? The main symptom of this condition is pain in the lower back, at the site of the strain. Pain may extend (radiate) down one or both legs. How is this diagnosed? This condition is diagnosed based on:  Your symptoms.  Your medical history.  A physical exam. ? Your health care provider may push on certain areas of your back to determine the source of your pain. ? You may be asked to bend forward, backward, and side to side to assess the severity of your pain and your range of motion.  Imaging tests, such as: ? X-rays. ? MRI.  How is this treated? Treatment for this condition may include:  Putting heat and cold on the affected  area.  Medicines to help relieve pain and relax your muscles (muscle relaxants).  NSAIDs to help reduce swelling and discomfort.  When your symptoms improve, it is important to gradually return to your normal routine as soon as possible to reduce pain, avoid stiffness, and avoid loss of muscle strength. Generally, symptoms should improve within 6 weeks of treatment. However, recovery time varies. Follow these instructions at home: Managing pain, stiffness, and swelling   If directed, put ice on the injured area during the first 24 hours after your strain. ? Put ice in a plastic bag. ? Place a towel between your skin and the bag. ? Leave the ice on for 20 minutes, 2-3 times a day.  If directed, put heat on the affected area as often as told by your health care provider. Use the heat source that your health care provider recommends, such as a moist heat pack or a heating pad. ? Place a towel between your skin and the heat source. ? Leave the heat on for 20-30 minutes. ? Remove the heat if your skin turns bright red. This is especially important if you are unable to feel pain, heat, or cold. You may have a greater risk of getting burned. Activity  Rest and return to your normal activities as told by your health care provider. Ask your health care provider what activities are safe for you.  Avoid activities that take a lot of energy for as long as told by your health  care provider. General instructions  Take over-the-counter and prescription medicines only as told by your health care provider.  Donot drive or use heavy machinery while taking prescription pain medicine.  Do not use any products that contain nicotine or tobacco, such as cigarettes and e-cigarettes. If you need help quitting, ask your health care provider.  Keep all follow-up visits as told by your health care provider. This is important. How is this prevented?  Use correct form when playing sports and lifting heavy  objects.  Use good posture when sitting and standing.  Maintain a healthy weight.  Sleep on a mattress with medium firmness to support your back.  Be safe and responsible while being active to avoid falls.  Do at least 150 minutes of moderate-intensity exercise each week, such as brisk walking or water aerobics. Try a form of exercise that takes stress off your back, such as swimming or stationary cycling.  Maintain physical fitness, including: ? Strength. ? Flexibility. ? Cardiovascular fitness. ? Endurance. Contact a health care provider if:  Your back pain does not improve after 6 weeks of treatment.  Your symptoms get worse. Get help right away if:  Your back pain is severe.  You cannot stand or walk.  You have difficulty controlling when you urinate or when you have a bowel movement.  You feel nauseous or you vomit.  Your feet get very cold.  You have numbness, tingling, weakness, or problems using your arms or legs.  You develop any of the following: ? Shortness of breath. ? Dizziness. ? Pain in your legs. ? Weakness in your buttocks or legs. ? Discoloration of the skin on your toes or legs. This information is not intended to replace advice given to you by your health care provider. Make sure you discuss any questions you have with your health care provider. Document Released: 05/26/2005 Document Revised: 03/05/2016 Document Reviewed: 01/18/2016 Elsevier Interactive Patient Education  [employer].

## 2018-06-22 ENCOUNTER — Encounter: Payer: Self-pay | Admitting: Rehabilitative and Restorative Service Providers"

## 2018-06-22 ENCOUNTER — Ambulatory Visit: Payer: 59 | Admitting: Rehabilitative and Restorative Service Providers"

## 2018-06-22 DIAGNOSIS — M5382 Other specified dorsopathies, cervical region: Secondary | ICD-10-CM | POA: Diagnosis not present

## 2018-06-22 DIAGNOSIS — M545 Low back pain, unspecified: Secondary | ICD-10-CM

## 2018-06-22 DIAGNOSIS — R29898 Other symptoms and signs involving the musculoskeletal system: Secondary | ICD-10-CM | POA: Diagnosis not present

## 2018-06-22 DIAGNOSIS — M629 Disorder of muscle, unspecified: Secondary | ICD-10-CM
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
## 2018-06-22 NOTE — Patient Instructions (Signed)
Psoas; QL; piriformis/glut med; glut min    Double Knee to Chest (Flexion)    Gently pull both knees toward chest. Feel stretch in lower back or buttock area. Breathing deeply, Hold _15-20___ seconds. Repeat __3__ times. Do __2-3__ sessions per day.   Standing - tighten core and push hips forward. Hold for several seconds  Self massage using ~ 4 in plastic ball   TENS UNIT: This is helpful for muscle pain and spasm.   Search and Purchase a TENS 7000 2nd edition at www.tenspros.com. It should be less than $30.     TENS unit instructions: Do not shower or bathe with the unit on Turn the unit off before removing electrodes or batteries If the electrodes lose stickiness add a drop of water to the electrodes after they are disconnected from the unit and place on plastic sheet. If you continued to have difficulty, call the TENS unit company to purchase more electrodes. Do not apply lotion on the skin area prior to use. Make sure the skin is clean and dry as this will help prolong the life of the electrodes. After use, always check skin for unusual red areas, rash or other skin difficulties. If there are any skin problems, does not apply electrodes to the same area. Never remove the electrodes from the unit by pulling the wires. Do not use the TENS unit or electrodes other than as directed. Do not change electrode placement without consultating your therapist or physician. Keep 2 fingers with between each electrode.   Sleeping on Back  Place pillow under knees. A pillow with cervical support and a roll around waist are also helpful. Copyright  VHI. All rights reserved.  Sleeping on Side Place pillow between knees. Use cervical support under neck and a roll around waist as needed. Copyright  VHI. All rights reserved.   Sleeping on Stomach   If this is the only desirable sleeping position, place pillow under lower legs, and under stomach or chest as needed.  Posture -  Sitting   Sit upright, head facing forward. Try using a roll to support lower back. Keep shoulders relaxed, and avoid rounded back. Keep hips level with knees. Avoid crossing legs for long periods. Stand to Sit / Sit to Stand   To sit: Bend knees to lower self onto front edge of chair, then scoot back on seat. To stand: Reverse sequence by placing one foot forward, and scoot to front of seat. Use rocking motion to stand up.   Work Height and Reach  Ideal work height is no more than 2 to 4 inches below elbow level when standing, and at elbow level when sitting. Reaching should be limited to arm's length, with elbows slightly bent.  Bending  Bend at hips and knees, not back. Keep feet shoulder-width apart.    Posture - Standing   Good posture is important. Avoid slouching and forward head thrust. Maintain curve in low back and align ears over shoul- ders, hips over ankles.  Alternating Positions   Alternate tasks and change positions frequently to reduce fatigue and muscle tension. Take rest breaks. Computer Work   Position work to Art gallery manager. Use proper work and seat height. Keep shoulders back and down, wrists straight, and elbows at right angles. Use chair that provides full back support. Add footrest and lumbar roll as needed.  Getting Into / Out of Car  Lower self onto seat, scoot back, then bring in one leg at a time. Reverse sequence to get  out.  Dressing  Lie on back to pull socks or slacks over feet, or sit and bend leg while keeping back straight.    Housework - Sink  Place one foot on ledge of cabinet under sink when standing at sink for prolonged periods.   Pushing / Pulling  Pushing is preferable to pulling. Keep back in proper alignment, and use leg muscles to do the work.  Deep Squat   Squat and lift with both arms held against upper trunk. Tighten stomach muscles without holding breath. Use smooth movements to avoid jerking.  Avoid  Twisting   Avoid twisting or bending back. Pivot around using foot movements, and bend at knees if needed when reaching for articles.  Carrying Luggage   Distribute weight evenly on both sides. Use a cart whenever possible. Do not twist trunk. Move body as a unit.   Lifting Principles .Maintain proper posture and head alignment. .Slide object as close as possible before lifting. .Move obstacles out of the way. .Test before lifting; ask for help if too heavy. .Tighten stomach muscles without holding breath. .Use smooth movements; do not jerk. .Use legs to do the work, and pivot with feet. .Distribute the work load symmetrically and close to the center of trunk. .Push instead of pull whenever possible.   Ask For Help   Ask for help and delegate to others when possible. Coordinate your movements when lifting together, and maintain the low back curve.  Log Roll   Lying on back, bend left knee and place left arm across chest. Roll all in one movement to the right. Reverse to roll to the left. Always move as one unit. Housework - Sweeping  Use long-handled equipment to avoid stooping.   Housework - Wiping  Position yourself as close as possible to reach work surface. Avoid straining your back.  Laundry - Unloading Wash   To unload small items at bottom of washer, lift leg opposite to arm being used to reach.  Gardening - Raking  Move close to area to be raked. Use arm movements to do the work. Keep back straight and avoid twisting.     Cart  When reaching into cart with one arm, lift opposite leg to keep back straight.   Getting Into / Out of Bed  Lower self to lie down on one side by raising legs and lowering head at the same time. Use arms to assist moving without twisting. Bend both knees to roll onto back if desired. To sit up, start from lying on side, and use same move-ments in reverse. Housework - Vacuuming  Hold the vacuum with arm held at side. Step back  and forth to move it, keeping head up. Avoid twisting.   Laundry - Armed forces training and education officer so that bending and twisting can be avoided.   Laundry - Unloading Dryer  Squat down to reach into clothes dryer or use a reacher.  Gardening - Weeding / Psychiatric nurse or Kneel. Knee pads may be helpful.

## 2018-07-05 ENCOUNTER — Other Ambulatory Visit: Payer: Self-pay | Admitting: Family Medicine

## 2018-08-02 ENCOUNTER — Encounter: Payer: 59 | Admitting: Family Medicine

## 2018-08-09 ENCOUNTER — Encounter: Payer: 59 | Admitting: Family Medicine

## 2018-08-16 ENCOUNTER — Encounter: Payer: 59 | Admitting: Family Medicine

## 2018-09-06 ENCOUNTER — Encounter: Payer: 59 | Admitting: Family Medicine

## 2018-09-13 ENCOUNTER — Encounter: Payer: 59 | Admitting: Family Medicine

## 2018-09-17 ENCOUNTER — Other Ambulatory Visit: Payer: Self-pay | Admitting: Family Medicine

## 2018-09-17 DIAGNOSIS — E119 Type 2 diabetes mellitus without complications: Secondary | ICD-10-CM

## 2018-09-20 LAB — HM DIABETES EYE EXAM

## 2018-09-22 ENCOUNTER — Telehealth: Payer: Self-pay | Admitting: Family Medicine

## 2018-09-22 ENCOUNTER — Encounter: Payer: Self-pay | Admitting: Family Medicine

## 2018-09-22 NOTE — Telephone Encounter (Signed)
Diabetic eye exam report received.  Date of service September 20, 2018.  No diabetic retinopathy detected.  Result will be sent to abstract.

## 2018-10-04 ENCOUNTER — Encounter: Payer: 59 | Admitting: Family Medicine

## 2018-12-11 ENCOUNTER — Other Ambulatory Visit: Payer: Self-pay | Admitting: Family Medicine

## 2018-12-12 ENCOUNTER — Other Ambulatory Visit: Payer: Self-pay | Admitting: Family Medicine

## 2019-03-11 ENCOUNTER — Other Ambulatory Visit: Payer: Self-pay | Admitting: Family Medicine

## 2019-03-11 DIAGNOSIS — E119 Type 2 diabetes mellitus without complications: Secondary | ICD-10-CM

## 2019-06-01 ENCOUNTER — Other Ambulatory Visit: Payer: Self-pay | Admitting: Family Medicine

## 2019-06-06 ENCOUNTER — Ambulatory Visit (INDEPENDENT_AMBULATORY_CARE_PROVIDER_SITE_OTHER): Payer: 59 | Admitting: Family Medicine

## 2019-06-06 ENCOUNTER — Encounter: Payer: Self-pay | Admitting: Family Medicine

## 2019-06-06 ENCOUNTER — Other Ambulatory Visit: Payer: Self-pay

## 2019-06-06 VITALS — BP 137/78 | HR 97 | Wt 345.0 lb

## 2019-06-06 DIAGNOSIS — I10 Essential (primary) hypertension: Secondary | ICD-10-CM

## 2019-06-06 DIAGNOSIS — Z23 Encounter for immunization: Secondary | ICD-10-CM

## 2019-06-06 DIAGNOSIS — Z1231 Encounter for screening mammogram for malignant neoplasm of breast: Secondary | ICD-10-CM | POA: Diagnosis not present

## 2019-06-06 DIAGNOSIS — Z0000 Encounter for general adult medical examination without abnormal findings: Secondary | ICD-10-CM | POA: Diagnosis not present

## 2019-06-06 DIAGNOSIS — R928 Other abnormal and inconclusive findings on diagnostic imaging of breast: Secondary | ICD-10-CM

## 2019-06-06 DIAGNOSIS — E119 Type 2 diabetes mellitus without complications: Secondary | ICD-10-CM

## 2019-06-06 DIAGNOSIS — R7401 Elevation of levels of liver transaminase levels: Secondary | ICD-10-CM

## 2019-06-06 LAB — POCT GLYCOSYLATED HEMOGLOBIN (HGB A1C): HbA1c, POC (controlled diabetic range): 6.5 % (ref 0.0–7.0)

## 2019-06-06 MED ORDER — LOSARTAN POTASSIUM 100 MG PO TABS: 100.0000 mg | ORAL_TABLET | Freq: Every day | ORAL | 1 refills | Status: DC

## 2019-06-06 MED ORDER — CHLORTHALIDONE 25 MG PO TABS: 25.0000 mg | ORAL_TABLET | Freq: Every day | ORAL | 1 refills | Status: DC

## 2019-06-06 MED ORDER — AMLODIPINE BESYLATE 10 MG PO TABS: 10.0000 mg | ORAL_TABLET | Freq: Every day | ORAL | 1 refills | Status: DC

## 2019-06-06 MED ORDER — METFORMIN HCL 1000 MG PO TABS: 1000.0000 mg | ORAL_TABLET | Freq: Two times a day (BID) | ORAL | 1 refills | Status: DC

## 2019-06-06 MED ORDER — NORETHINDRONE 0.35 MG PO TABS: 1.0000 | ORAL_TABLET | Freq: Every day | ORAL | 3 refills | Status: DC

## 2019-06-06 NOTE — Progress Notes (Signed)
Sheila Gutierrez is a 48 y.o. female who presents to [name]: Primary Care [facility] Medicine today for well adult visit.   Doing reasonably well since last visit however she has had weight gain due to decreased ability to exercise.  She recently started exercise with water aerobics.  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  Currently managed with amlodipine chlorthalidone and losartan.  Additionally she has a history of diabetes managed with metformin. She has been out of the metformin for a few months.   ROS as above:  Past Medical History:  Diagnosis Date  . Abnormal mammogram 11/29/2012   Repeat left diagnostic September 2014   . 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 Essential hypertension, benign 12/22/2012  . Obesity 11/29/2012  . Type 2 diabetes mellitus (HCC) 08/06/2014   Past Surgical History:  Procedure Laterality Date  . REDUCTION MAMMAPLASTY Bilateral 2010   Social History   Tobacco Use  . Smoking status: Never Smoker  . Smokeless tobacco: Never Used  Substance Use Topics  . Alcohol use: Yes    Comment: once a month   family history includes Hypertension in her father and mother; Pancreatic cancer in her mother.  Medications: Current Outpatient Medications  Medication Sig Dispense Refill  . AMBULATORY NON FORMULARY MEDICATION Oral contraceptive (unknown dose/agent[s])    . amLODipine (NORVASC) 10 MG tablet Take 1 tablet (10 mg total) by mouth daily. 90 tablet 1  . chlorthalidone (HYGROTON) 25 MG tablet Take 1 tablet (25 mg total) by mouth daily. 90 tablet 1  . losartan (COZAAR) 100 MG tablet Take 1 tablet (100 mg total) by mouth daily. 90 tablet 1  . metFORMIN (GLUCOPHAGE) 1000 MG tablet Take 1 tablet (1,000 mg total) by mouth 2 (two) times daily. 180 tablet 1  . norethindrone (MICRONOR) 0.35 MG tablet  Take 1 tablet (0.35 mg total) by mouth daily. 84 tablet 3   No current facility-administered medications for this visit.    Allergies  Allergen Reactions  . Lisinopril Swelling    Lip angioedema    Health Maintenance Health Maintenance  Topic Date Due  . HEMOGLOBIN A1C  12/14/2018  . INFLUENZA VACCINE  03/31/2019  . FOOT EXAM  06/15/2019  . OPHTHALMOLOGY EXAM  09/21/2019  . PAP SMEAR-Modifier  07/14/2021  . TETANUS/TDAP  12/23/2022  . PNEUMOCOCCAL POLYSACCHARIDE VACCINE AGE 67-64 HIGH RISK  Completed  . HIV Screening  Completed     Exam:  BP 137/78   Pulse 97   Wt (!) 345 lb (156.5 kg)   LMP 05/30/2019 (Exact Date)   BMI 48.12 kg/m  Wt Readings from Last 5 Encounters:  06/06/19 (!) 345 lb (156.5 kg)  06/14/18 (!) 331 lb (150.1 kg)  02/27/18 (!) 316 lb (143.3 kg)  07/27/17 (!) 324 lb (147 kg)  05/25/17 (!) 330 lb (149.7 kg)      Gen: Well NAD HEENT: EOMI,  MMM Lungs: Normal work of breathing. CTABL Heart: RRR no MRG Abd: NABS, Soft. Nondistended, Nontender Exts: Brisk capillary refill, warm and well perfused.      Lab and Radiology Results POC A1c 6.5   Assessment and Plan: 48 y.o. female with  Well adult.  Doing reasonably well.  Central issue is obesity.  Work on diet and exercise.  Continue current regimen for hypertension diabetes etc.  We will get basic fasting labs today.  Recheck in 6 months.  Patient had abnormal mammogram with diagnostic mammogram December 2018 with plan to recheck in 6 months.  Plan to reorder diagnostic mammogram now to follow this issue up.  Presumably if normal she can resume normal screening mammograms.  Otherwise health maintenance is up-to-date.  Flu vaccine given today.   Recheck 6 months with new PCP.   Orders Placed This Encounter  Procedures  . MM Digital Diagnostic Bilat    Standing Status:   Future    Standing Expiration Date:   08/05/2020    Order Specific Question:   Reason for Exam (SYMPTOM  OR DIAGNOSIS  REQUIRED)    Answer:   repear diag mammo    Order Specific Question:   Is the patient pregnant?    Answer:   No    Order Specific Question:   Preferred imaging location?    Answer:   [facility]  . Flu Vaccine QUAD 36+ mos IM  . CBC  . COMPLETE METABOLIC PANEL WITH GFR  . Lipid Panel w/reflex Direct LDL  . POCT HgB A1C   Meds ordered this encounter  Medications  . amLODipine (NORVASC) 10 MG tablet    Sig: Take 1 tablet (10 mg total) by mouth daily.    Dispense:  90 tablet    Refill:  1  . chlorthalidone (HYGROTON) 25 MG tablet    Sig: Take 1 tablet (25 mg total) by mouth daily.    Dispense:  90 tablet    Refill:  1  . losartan (COZAAR) 100 MG tablet    Sig: Take 1 tablet (100 mg total) by mouth daily.    Dispense:  90 tablet    Refill:  1  . norethindrone (MICRONOR) 0.35 MG tablet    Sig: Take 1 tablet (0.35 mg total) by mouth daily.    Dispense:  84 tablet    Refill:  3  . metFORMIN (GLUCOPHAGE) 1000 MG tablet    Sig: Take 1 tablet (1,000 mg total) by mouth 2 (two) times daily.    Dispense:  180 tablet    Refill:  1     Discussed warning signs or symptoms. Please see discharge instructions. Patient expresses understanding.

## 2019-06-06 NOTE — Patient Instructions (Addendum)
Thank you for coming in today. Get labs now.  If all is well recheck in 6 months with Dr Zigmund Daniel.  Return sooner as needed.  Work on low calorie diet and exercise.  You should hear about diagnostic mammogram soon.

## 2019-06-07 LAB — CBC
HCT: 36.7 % (ref 35.0–45.0)
Hemoglobin: 11.9 g/dL (ref 11.7–15.5)
MCH: 27.7 pg (ref 27.0–33.0)
MCHC: 32.4 g/dL (ref 32.0–36.0)
MCV: 85.5 fL (ref 80.0–100.0)
MPV: 10.8 fL (ref 7.5–12.5)
Platelets: 336 10*3/uL (ref 140–400)
RBC: 4.29 10*6/uL (ref 3.80–5.10)
RDW: 14.3 % (ref 11.0–15.0)
WBC: 10.2 10*3/uL (ref 3.8–10.8)

## 2019-06-07 LAB — COMPLETE METABOLIC PANEL WITH GFR
AG Ratio: 1.3 (calc) (ref 1.0–2.5)
ALT: 19 U/L (ref 6–29)
AST: 12 U/L (ref 10–35)
Albumin: 4.1 g/dL (ref 3.6–5.1)
Alkaline phosphatase (APISO): 55 U/L (ref 31–125)
BUN: 16 mg/dL (ref 7–25)
CO2: 26 mmol/L (ref 20–32)
Calcium: 9.3 mg/dL (ref 8.6–10.2)
Chloride: 105 mmol/L (ref 98–110)
Creat: 0.79 mg/dL (ref 0.50–1.10)
GFR, Est African American: 103 mL/min/{1.73_m2} (ref >=60)
GFR, Est Non African American: 89 mL/min/{1.73_m2} (ref >=60)
Globulin: 3.2 g/dL (calc) (ref 1.9–3.7)
Glucose, Bld: 125 mg/dL — ABNORMAL HIGH (ref 65–99)
Potassium: 4.2 mmol/L (ref 3.5–5.3)
Sodium: 140 mmol/L (ref 135–146)
Total Bilirubin: 0.5 mg/dL (ref 0.2–1.2)
Total Protein: 7.3 g/dL (ref 6.1–8.1)

## 2019-06-07 LAB — LIPID PANEL W/REFLEX DIRECT LDL
Cholesterol: 126 mg/dL (ref <200)
HDL: 46 mg/dL — ABNORMAL LOW (ref >=50)
LDL Cholesterol (Calc): 64 mg/dL (calc)
Non-HDL Cholesterol (Calc): 80 mg/dL (calc) (ref <130)
Total CHOL/HDL Ratio: 2.7 (calc) (ref <5.0)
Triglycerides: 76 mg/dL (ref <150)

## 2019-06-27 ENCOUNTER — Other Ambulatory Visit: Payer: Self-pay

## 2019-06-27 ENCOUNTER — Ambulatory Visit
Admission: RE | Admit: 2019-06-27 | Discharge: 2019-06-27 | Disposition: A | Discharge status: Home / Self Care | Payer: 59 | Source: Ambulatory Visit | Attending: Family Medicine | Admitting: Family Medicine

## 2019-06-27 DIAGNOSIS — Z1231 Encounter for screening mammogram for malignant neoplasm of breast: Secondary | ICD-10-CM

## 2019-06-27 DIAGNOSIS — R928 Other abnormal and inconclusive findings on diagnostic imaging of breast: Secondary | ICD-10-CM

## 2019-10-18 IMAGING — DX DG LUMBAR SPINE COMPLETE 4+V
5 series · 5 of 5 positions shown · non-contrast
Comparison: None

CLINICAL DATA: Lower back pain for 3 months, diabetes mellitus,
hypertension

EXAM:
LUMBAR SPINE - COMPLETE 4+ VIEW

[l-spine ap]
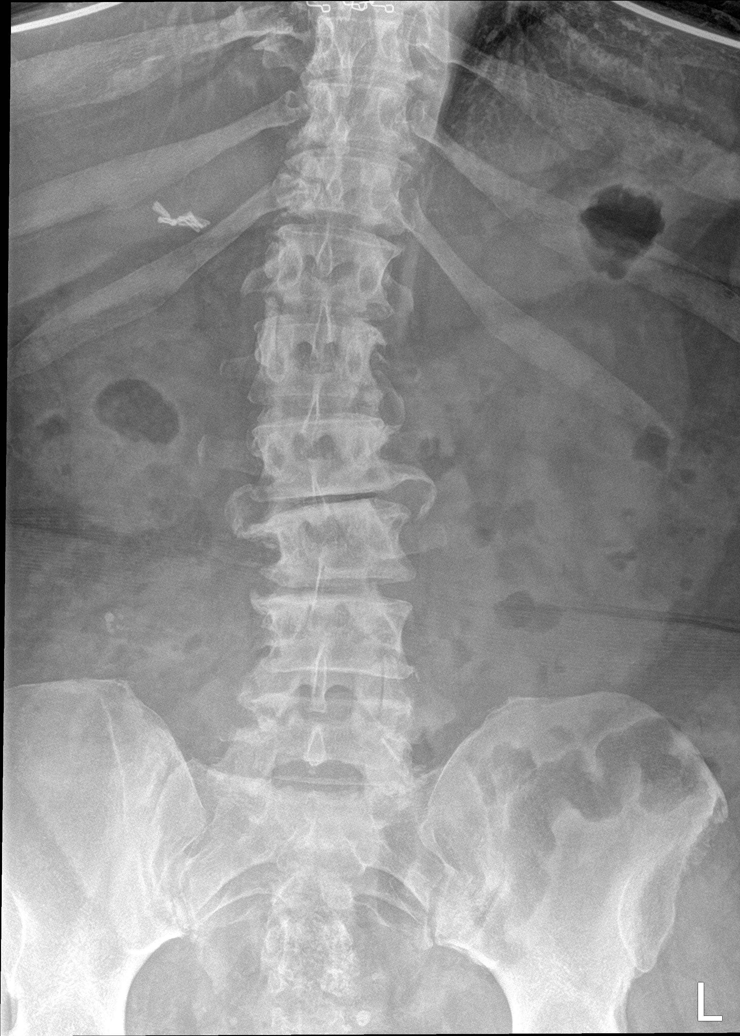

[l-spine obl (1 of 2)]
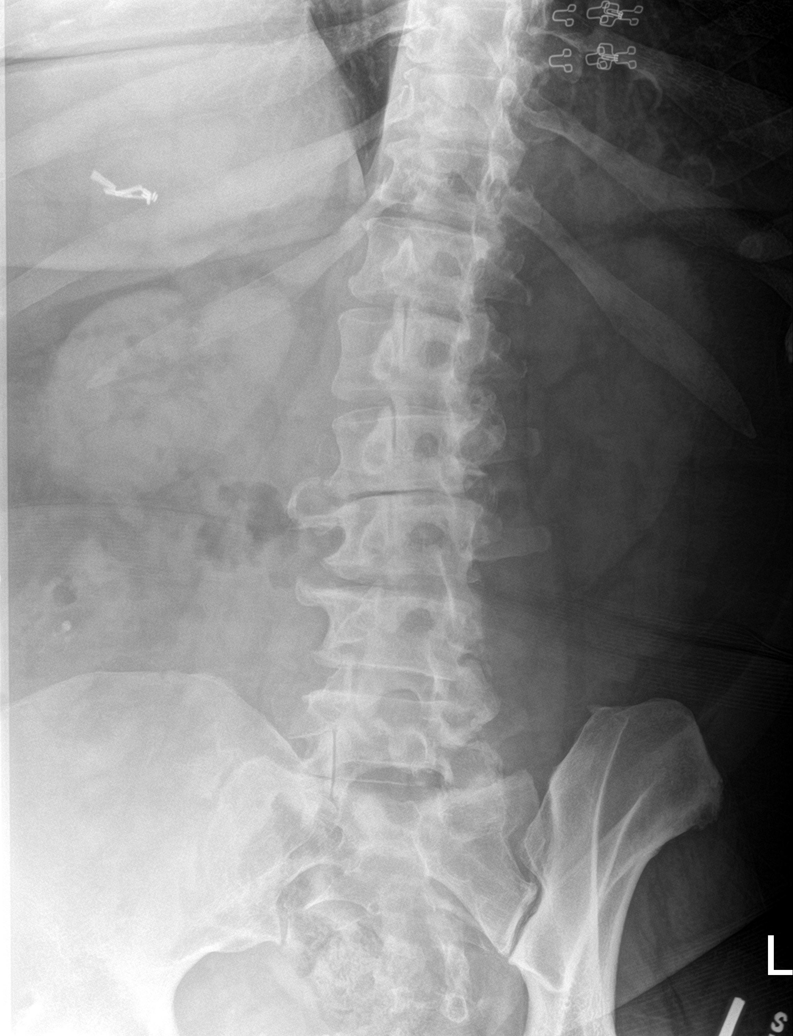

[l-spine obl (2 of 2)]
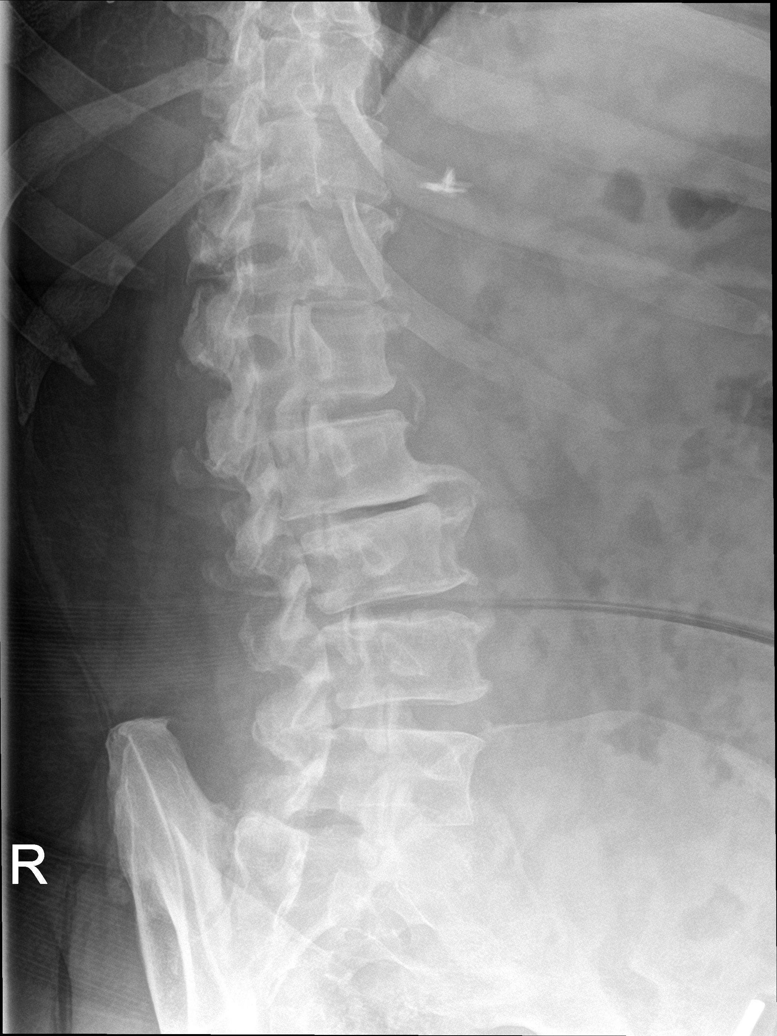

[l-spine lat]
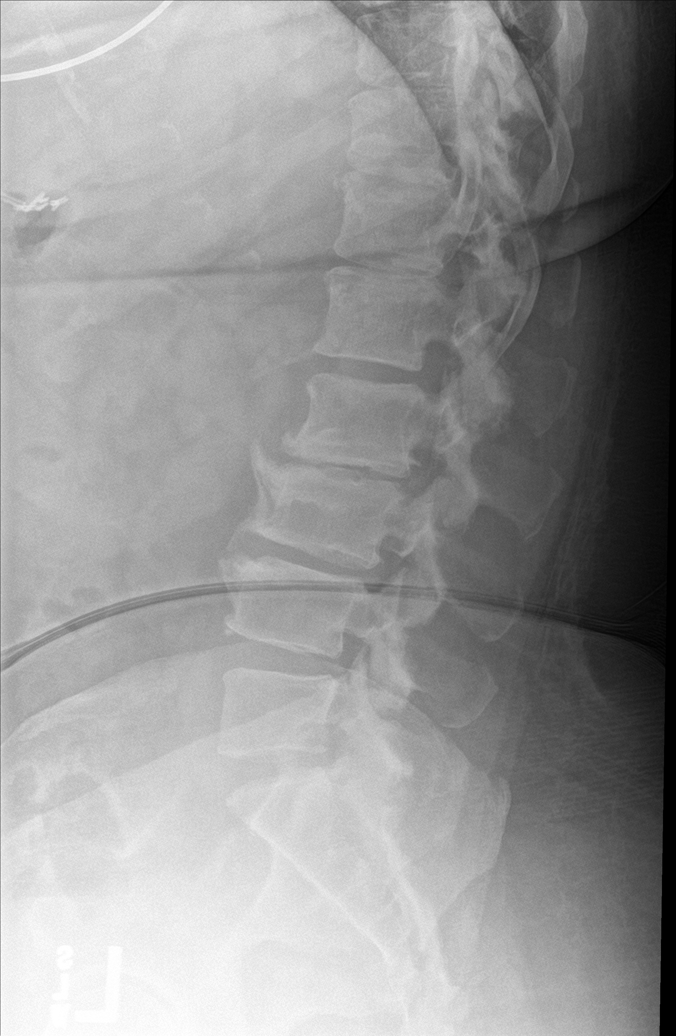

[l-spine spot]
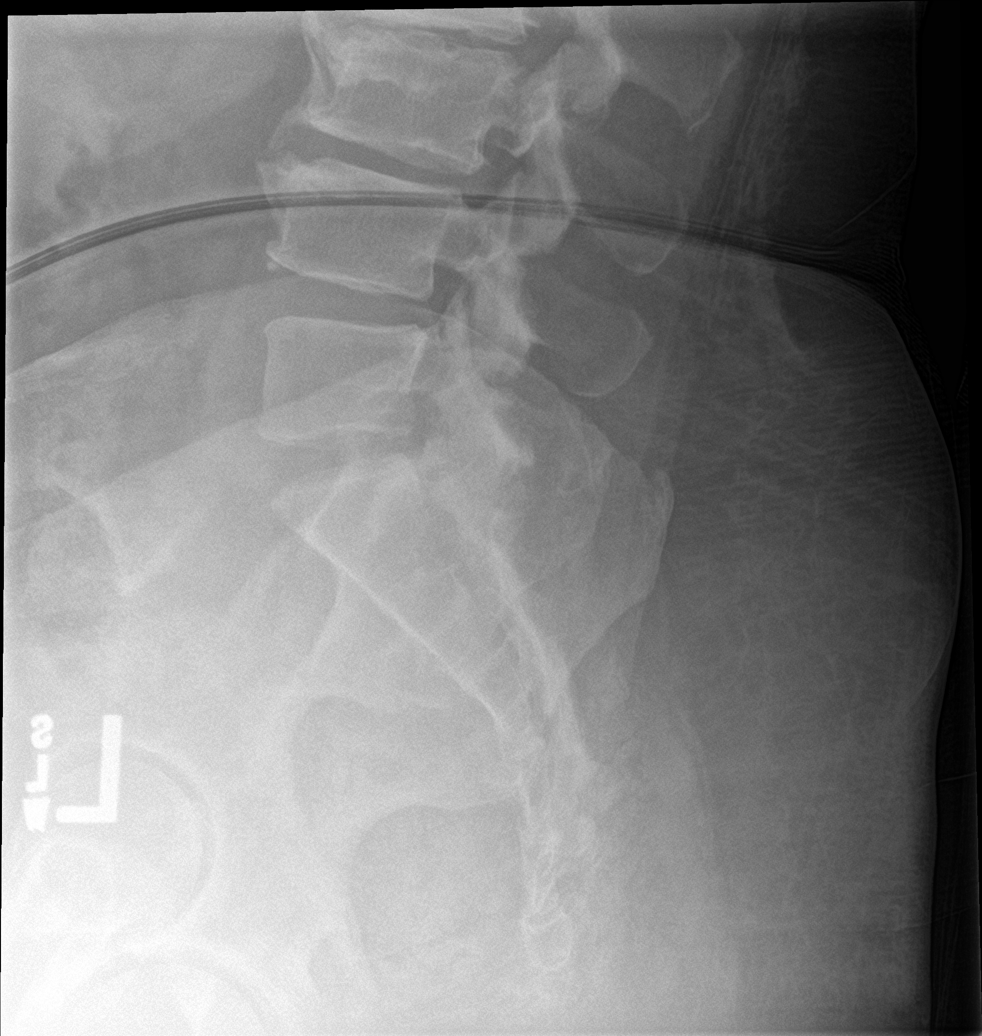

[5 of 5 positions shown; findings below may reference images not displayed]

FINDINGS: 6 non-rib-bearing lumbar type segments; without prior exams for
numbering, current study is presumptively labeled with last
independent vertebra labeled as L5.

If patient is to have intervention or surgery within the lumbar
spine, recommend correlation of this numbering system with
additional radiographs/imaging.

Multilevel disc space narrowing and endplate spur formation
throughout lumbar spine.

Biconvex thoracolumbar scoliosis.

Vertebral body heights maintained without fracture or subluxation.

No bone destruction.

Questionable LEFT spondylolysis of L4.

SI joints preserved.
IMPRESSION: Degenerative disc and facet disease changes of the lumbar spine with
biconvex scoliosis.

Questionable LEFT spondylolysis L4.

Presumptive labeling of lumbar spine due to 6 non-rib-bearing
segments, with last vertebra labeled as L5.

## 2019-11-07 ENCOUNTER — Other Ambulatory Visit: Payer: Self-pay | Admitting: Family Medicine

## 2019-11-25 ENCOUNTER — Other Ambulatory Visit: Payer: Self-pay | Admitting: Family Medicine

## 2019-11-25 DIAGNOSIS — E119 Type 2 diabetes mellitus without complications: Secondary | ICD-10-CM

## 2019-12-14 ENCOUNTER — Other Ambulatory Visit: Payer: Self-pay | Admitting: Family Medicine

## 2019-12-18 NOTE — Telephone Encounter (Signed)
Ok to fill each x30 days but needs appt with me.

## 2019-12-18 NOTE — Telephone Encounter (Signed)
Patient states that he physical is not up yet and wanted to know if she can get a refill on her medication like her BP meds. States she doesn't think she needs to come in. Please advise, was a former Dr.Corey patient.

## 2019-12-21 ENCOUNTER — Other Ambulatory Visit: Payer: Self-pay | Admitting: Family Medicine

## 2019-12-21 DIAGNOSIS — E119 Type 2 diabetes mellitus without complications: Secondary | ICD-10-CM

## 2019-12-21 NOTE — Telephone Encounter (Signed)
Please help with Dr. Ashley Royalty pt/scheduled 01/02/2020.

## 2020-01-02 ENCOUNTER — Ambulatory Visit: Payer: 59 | Admitting: Family Medicine

## 2020-01-09 ENCOUNTER — Other Ambulatory Visit: Payer: Self-pay | Admitting: Family Medicine

## 2020-01-09 ENCOUNTER — Encounter: Payer: Self-pay | Admitting: Family Medicine

## 2020-01-09 ENCOUNTER — Ambulatory Visit (INDEPENDENT_AMBULATORY_CARE_PROVIDER_SITE_OTHER): Payer: 59 | Admitting: Family Medicine

## 2020-01-09 ENCOUNTER — Other Ambulatory Visit: Payer: Self-pay

## 2020-01-09 VITALS — BP 130/83 | HR 91 | Temp 98.4°F | Ht 69.0 in | Wt 346.2 lb

## 2020-01-09 DIAGNOSIS — M47816 Spondylosis without myelopathy or radiculopathy, lumbar region: Secondary | ICD-10-CM | POA: Diagnosis not present

## 2020-01-09 DIAGNOSIS — E119 Type 2 diabetes mellitus without complications: Secondary | ICD-10-CM

## 2020-01-09 DIAGNOSIS — I10 Essential (primary) hypertension: Secondary | ICD-10-CM

## 2020-01-09 LAB — POCT GLYCOSYLATED HEMOGLOBIN (HGB A1C): Hemoglobin A1C: 6.4 % — AB (ref 4.0–5.6)

## 2020-01-09 MED ORDER — METFORMIN HCL ER 500 MG PO TB24: 1500.0000 mg | ORAL_TABLET | Freq: Every day | ORAL | 2 refills | Status: DC

## 2020-01-09 MED ORDER — LOSARTAN POTASSIUM 100 MG PO TABS: 100.0000 mg | ORAL_TABLET | Freq: Every day | ORAL | 2 refills | Status: DC

## 2020-01-09 MED ORDER — CHLORTHALIDONE 25 MG PO TABS: 25.0000 mg | ORAL_TABLET | Freq: Every day | ORAL | 2 refills | Status: DC

## 2020-01-09 MED ORDER — METFORMIN HCL ER 500 MG PO TB24: 1500.0000 mg | ORAL_TABLET | Freq: Every day | ORAL | 1 refills | Status: DC

## 2020-01-09 MED ORDER — NAPROXEN 500 MG PO TABS: 500.0000 mg | ORAL_TABLET | Freq: Two times a day (BID) | ORAL | 0 refills | Status: DC | PRN

## 2020-01-09 MED ORDER — AMLODIPINE BESYLATE 10 MG PO TABS: 10.0000 mg | ORAL_TABLET | Freq: Every day | ORAL | 2 refills | Status: DC

## 2020-01-09 NOTE — Progress Notes (Signed)
Sheila Gutierrez - 49 y.o. female MRN [id number]  Date of birth: June 01, 1971  Subjective Chief Complaint  Patient presents with  . Establish Care  . Hypertension    HPI Sheila Gutierrez is 49 y.o. female wit history of HTN and T2DM here today for follow up visit.   -HTN: Current management with amlodipine, chlorthalidone and losartan.  BP has been well controlled with current medications.  She denies side effects from medication.  Had angioedema from ACE-I previously but is tolerating losartan ok.  She denies chest pain, shortness of breath, palpitations, headache or vision changes.   -T2DM:  Current management with metformin.  She is tolerating this well.  Last a1c indicates that this has been well controlled.  She does often forget to take evening dose of metformin.  Her weight is stable and she denies symptoms related to her diabetes.   -Back pain:  Has had back pain for several months.  Previous xrays with spondylotic changes at L4.  She has tried OTC nsaids with some relief.  Unable to afford prolonged PT.  Denies radiation of pain, numbness or tingling.    ROS:  A comprehensive ROS was completed and negative except as noted per HPI  Allergies  Allergen Reactions  . Lisinopril Swelling    Lip angioedema    Past Medical History:  Diagnosis Date  . Abnormal mammogram 11/29/2012   Repeat left diagnostic September 2014   . 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 Essential hypertension, benign 12/22/2012  . Obesity 11/29/2012  . Type 2 diabetes mellitus ([place]) 08/06/2014    Past Surgical History:  Procedure Laterality Date  . REDUCTION MAMMAPLASTY Bilateral 2010    Social History   Socioeconomic History  . Marital status: Single    Spouse name: Not on file  . Number of children: Not on file  . Years of education: Not on file  . Highest education level: Not on file  Occupational History  . Not on file  Tobacco Use  . Smoking status:  Never Smoker  . Smokeless tobacco: Never Used  Substance and Sexual Activity  . Alcohol use: Yes    Comment: once a month  . Drug use: No  . Sexual activity: Yes  Other Topics Concern  . Not on file  Social History Narrative  . Not on file   Social Determinants of Health   Financial Resource Strain:   . Difficulty of Paying Living Expenses:   Food Insecurity:   . Worried About Charity fundraiser in the Last Year:   . Arboriculturist in the Last Year:   Transportation Needs:   . Film/video editor (Medical):   Marland Kitchen Lack of Transportation (Non-Medical):   Physical Activity:   . Days of Exercise per Week:   . Minutes of Exercise per Session:   Stress:   . Feeling of Stress :   Social Connections:   . Frequency of Communication with Friends and Family:   . Frequency of Social Gatherings with Friends and Family:   . Attends Religious Services:   . Active Member of Clubs or Organizations:   . Attends Archivist Meetings:   Marland Kitchen Marital Status:     Family History  Problem Relation Age of Onset  . Pancreatic cancer Mother   . Hypertension Mother        father  . Hypertension Father   . Breast cancer Cousin     Health Maintenance  Topic Date Due  . OPHTHALMOLOGY EXAM  09/21/2019  . HEMOGLOBIN A1C  12/05/2019  . INFLUENZA VACCINE  03/30/2020  . FOOT EXAM  06/05/2020  . PAP SMEAR-Modifier  07/14/2021  . TETANUS/TDAP  12/23/2022  . PNEUMOCOCCAL POLYSACCHARIDE VACCINE AGE 17-64 HIGH RISK  Completed  . COVID-19 Vaccine  Completed  . HIV Screening  Completed     ----------------------------------------------------------------------------------------------------------------------------------------------------------------------------------------------------------------- Physical Exam BP 130/83 (BP Location: Left Arm, Patient Position: Sitting, Cuff Size: Large)   Pulse 91   Temp 98.4 F (36.9 C) (Oral)   Ht 5\' 9"  (1.753 m)   Wt (!) 346 lb 3.2 oz (157 kg)    SpO2 98%   BMI 51.12 kg/m   Physical Exam Constitutional:      Appearance: Normal appearance.  HENT:     Head: Normocephalic and atraumatic.  Eyes:     General: No scleral icterus. Cardiovascular:     Rate and Rhythm: Normal rate and regular rhythm.  Pulmonary:     Effort: Pulmonary effort is normal.     Breath sounds: Normal breath sounds.  Musculoskeletal:     Cervical back: Neck supple.  Skin:    General: Skin is warm and dry.  Neurological:     General: No focal deficit present.     Mental Status: She is alert.  Psychiatric:        Mood and Affect: Mood normal.        Behavior: Behavior normal.     ------------------------------------------------------------------------------------------------------------------------------------------------------------------------------------------------------------------- Assessment and Plan  Essential hypertension, benign Blood pressure is at goal at for age and co-morbidities.  I recommend she continue current medications.  In addition they were instructed to follow a low sodium diet with regular exercise to help to maintain adequate control of blood pressure.    Type 2 diabetes mellitus Most recent A1c of  Lab Results  Component Value Date   HGBA1C 6.4 (A) 01/09/2020   indicates diabetes has worsened some since last visit but still well controlled. I will change her metformin to metformin XR as she has difficulty remembering to take evening dose.   Counseled on healthy, low carb diet and recommend frequent activity to help with maintaining good control of blood sugars.    Lumbar spondylosis Given handout for home back exercises.  Rx for naproxen 500mg  as needed.     Meds ordered this encounter  Medications  . DISCONTD: metFORMIN (GLUCOPHAGE-XR) 500 MG 24 hr tablet    Sig: Take 3 tablets (1,500 mg total) by mouth daily with breakfast.    Dispense:  135 tablet    Refill:  2  . naproxen (NAPROSYN) 500 MG tablet    Sig:  Take 1 tablet (500 mg total) by mouth 2 (two) times daily as needed for moderate pain. Take with food    Dispense:  60 tablet    Refill:  0  . metFORMIN (GLUCOPHAGE-XR) 500 MG 24 hr tablet    Sig: Take 3 tablets (1,500 mg total) by mouth daily with breakfast.    Dispense:  270 tablet    Refill:  1    Please disregard previous rx for metformin    Return in about 6 months (around 07/11/2020) for HTN/DM.    This visit occurred during the SARS-CoV-2 public health emergency.  Safety protocols were in place, including screening questions prior to the visit, additional usage of staff PPE, and extensive cleaning of exam room while observing appropriate contact time as indicated for disinfecting solutions.

## 2020-01-09 NOTE — Assessment & Plan Note (Signed)
Given handout for home back exercises.  Rx for naproxen 500mg  as needed.

## 2020-01-09 NOTE — Patient Instructions (Addendum)
Great to meet you today! Change metformin to Metformin XR 1500mg  (3 tabs) in the morning    Back Exercises The following exercises strengthen the muscles that help to support the trunk and back. They also help to keep the lower back flexible. Doing these exercises can help to prevent back pain or lessen existing pain.  If you have back pain or discomfort, try doing these exercises 2-3 times each day or as told by your health care provider.  As your pain improves, do them once each day, but increase the number of times that you repeat the steps for each exercise (do more repetitions).  To prevent the recurrence of back pain, continue to do these exercises once each day or as told by your health care provider. Do exercises exactly as told by your health care provider and adjust them as directed. It is normal to feel mild stretching, pulling, tightness, or discomfort as you do these exercises, but you should stop right away if you feel sudden pain or your pain gets worse. Exercises Single knee to chest Repeat these steps 3-5 times for each leg: 1. Lie on your back on a firm bed or the floor with your legs extended. 2. Bring one knee to your chest. Your other leg should stay extended and in contact with the floor. 3. Hold your knee in place by grabbing your knee or thigh with both hands and hold. 4. Pull on your knee until you feel a gentle stretch in your lower back or buttocks. 5. Hold the stretch for 10-30 seconds. 6. Slowly release and straighten your leg. Pelvic tilt Repeat these steps 5-10 times: 1. Lie on your back on a firm bed or the floor with your legs extended. 2. Bend your knees so they are pointing toward the ceiling and your feet are flat on the floor. 3. Tighten your lower abdominal muscles to press your lower back against the floor. This motion will tilt your pelvis so your tailbone points up toward the ceiling instead of pointing to your feet or the floor. 4. With gentle  tension and even breathing, hold this position for 5-10 seconds. Cat-cow Repeat these steps until your lower back becomes more flexible: 1. Get into a hands-and-knees position on a firm surface. Keep your hands under your shoulders, and keep your knees under your hips. You may place padding under your knees for comfort. 2. Let your head hang down toward your chest. Contract your abdominal muscles and point your tailbone toward the floor so your lower back becomes rounded like the back of a cat. 3. Hold this position for 5 seconds. 4. Slowly lift your head, let your abdominal muscles relax and point your tailbone up toward the ceiling so your back forms a sagging arch like the back of a cow. 5. Hold this position for 5 seconds.  Press-ups Repeat these steps 5-10 times: 1. Lie on your abdomen (face-down) on the floor. 2. Place your palms near your head, about shoulder-width apart. 3. Keeping your back as relaxed as possible and keeping your hips on the floor, slowly straighten your arms to raise the top half of your body and lift your shoulders. Do not use your back muscles to raise your upper torso. You may adjust the placement of your hands to make yourself more comfortable. 4. Hold this position for 5 seconds while you keep your back relaxed. 5. Slowly return to lying flat on the floor.  Bridges Repeat these steps 10 times: 1.  on your back on a firm surface. 2. Bend your knees so they are pointing toward the ceiling and your feet are flat on the floor. Your arms should be flat at your sides, next to your body. 3. Tighten your buttocks muscles and lift your buttocks off the floor until your waist is at almost the same height as your knees. You should feel the muscles working in your buttocks and the back of your thighs. If you do not feel these muscles, slide your feet 1-2 inches farther away from your buttocks. 4. Hold this position for 3-5 seconds. 5. Slowly lower your hips to the  starting position, and allow your buttocks muscles to relax completely. If this exercise is too easy, try doing it with your arms crossed over your chest. Abdominal crunches Repeat these steps 5-10 times: 1. Lie on your back on a firm bed or the floor with your legs extended. 2. Bend your knees so they are pointing toward the ceiling and your feet are flat on the floor. 3. Cross your arms over your chest. 4. Tip your chin slightly toward your chest without bending your neck. 5. Tighten your abdominal muscles and slowly raise your trunk (torso) high enough to lift your shoulder blades a tiny bit off the floor. Avoid raising your torso higher than that because it can put too much stress on your low back and does not help to strengthen your abdominal muscles. 6. Slowly return to your starting position. Back lifts Repeat these steps 5-10 times: 1. Lie on your abdomen (face-down) with your arms at your sides, and rest your forehead on the floor. 2. Tighten the muscles in your legs and your buttocks. 3. Slowly lift your chest off the floor while you keep your hips pressed to the floor. Keep the back of your head in line with the curve in your back. Your eyes should be looking at the floor. 4. Hold this position for 3-5 seconds. 5. Slowly return to your starting position. Contact a health care provider if:  Your back pain or discomfort gets much worse when you do an exercise.  Your worsening back pain or discomfort does not lessen within 2 hours after you exercise. If you have any of these problems, stop doing these exercises right away. Do not do them again unless your health care provider says that you can. Get help right away if:  You develop sudden, severe back pain. If this happens, stop doing the exercises right away. Do not do them again unless your health care provider says that you can. This information is not intended to replace advice given to you by your health care provider. Make sure  you discuss any questions you have with your health care provider. Document Revised: 12/21/2018 Document Reviewed: 05/18/2018 Elsevier Patient Education  [place].

## 2020-01-09 NOTE — Assessment & Plan Note (Signed)
Most recent A1c of  Lab Results  Component Value Date   HGBA1C 6.4 (A) 01/09/2020   indicates diabetes has worsened some since last visit but still well controlled. I will change her metformin to metformin XR as she has difficulty remembering to take evening dose.   Counseled on healthy, low carb diet and recommend frequent activity to help with maintaining good control of blood sugars.

## 2020-01-09 NOTE — Assessment & Plan Note (Signed)
Blood pressure is at goal at for age and co-morbidities.  I recommend she continue current medications.  In addition they were instructed to follow a low sodium diet with regular exercise to help to maintain adequate control of blood pressure.   

## 2020-01-16 ENCOUNTER — Other Ambulatory Visit: Payer: Self-pay | Admitting: Family Medicine

## 2020-01-16 DIAGNOSIS — E119 Type 2 diabetes mellitus without complications: Secondary | ICD-10-CM

## 2020-01-17 NOTE — Telephone Encounter (Signed)
CM-plz see refill req/looks like you increased it to 3qd/thx dmf

## 2020-03-27 ENCOUNTER — Other Ambulatory Visit: Payer: Self-pay | Admitting: Family Medicine

## 2020-03-27 ENCOUNTER — Encounter: Payer: Self-pay | Admitting: Family Medicine

## 2020-03-27 MED ORDER — CYCLOBENZAPRINE HCL 10 MG PO TABS
10.0000 mg | ORAL_TABLET | Freq: Three times a day (TID) | ORAL | 0 refills | Status: DC | PRN
Start: 2020-03-27 — End: 2020-06-11

## 2020-04-26 ENCOUNTER — Other Ambulatory Visit: Payer: Self-pay | Admitting: Family Medicine

## 2020-04-28 NOTE — Telephone Encounter (Signed)
Refill request submitted via MyChart. Routing to assistant to address.  

## 2020-06-11 ENCOUNTER — Encounter: Payer: Self-pay | Admitting: Family Medicine

## 2020-06-11 ENCOUNTER — Other Ambulatory Visit: Payer: Self-pay

## 2020-06-11 ENCOUNTER — Ambulatory Visit (INDEPENDENT_AMBULATORY_CARE_PROVIDER_SITE_OTHER): Payer: No Typology Code available for payment source | Admitting: Family Medicine

## 2020-06-11 VITALS — BP 143/83 | HR 102 | Temp 99.2°F | Wt 334.1 lb

## 2020-06-11 DIAGNOSIS — Z23 Encounter for immunization: Secondary | ICD-10-CM

## 2020-06-11 DIAGNOSIS — N926 Irregular menstruation, unspecified: Secondary | ICD-10-CM | POA: Diagnosis not present

## 2020-06-11 DIAGNOSIS — E119 Type 2 diabetes mellitus without complications: Secondary | ICD-10-CM

## 2020-06-11 DIAGNOSIS — Z0000 Encounter for general adult medical examination without abnormal findings: Secondary | ICD-10-CM | POA: Diagnosis not present

## 2020-06-11 DIAGNOSIS — M47816 Spondylosis without myelopathy or radiculopathy, lumbar region: Secondary | ICD-10-CM

## 2020-06-11 MED ORDER — NABUMETONE 750 MG PO TABS: 750.0000 mg | ORAL_TABLET | Freq: Every day | ORAL | 1 refills | Status: DC

## 2020-06-11 MED ORDER — METHOCARBAMOL 750 MG PO TABS: 750.0000 mg | ORAL_TABLET | Freq: Three times a day (TID) | ORAL | 1 refills | Status: DC | PRN

## 2020-06-11 NOTE — Assessment & Plan Note (Signed)
Continued back pain.  Will try changing to relafen and robaxin.  If not improving recommend MRI vs f/u with Dr. Karie Schwalbe

## 2020-06-11 NOTE — Progress Notes (Signed)
Sheila Gutierrez - 49 y.o. female MRN [id number]  Date of birth: 1970/10/27  Subjective Chief Complaint  Patient presents with  . Annual Exam    HPI Sheila Gutierrez is a 49 y.o. female here today for annual exam.    She continues to have issues with low back pain.  Didn't have much improvement with naproxen but flexeril did provide mild relief.  She has tried PT for her back in the past but didn't find this to be very beneficial.    She has had some irregular menstrual cycle, thinks she may be approaching menopause.  Would like to have labs checked.   PAP is up to date.  Due for repeat next year.   She is going to schedule mammogram.   She is a non-smoker and rarely consumes EtOH.   She does walk occasionally for exercise, weight is down about 12lbs since last visit.    She would like flu vaccine today.   Review of Systems  Constitutional: Negative for chills, fever, malaise/fatigue and weight loss.  HENT: Negative for congestion, ear pain and sore throat.   Eyes: Negative for blurred vision, double vision and pain.  Respiratory: Negative for cough and shortness of breath.   Cardiovascular: Negative for chest pain and palpitations.  Gastrointestinal: Negative for abdominal pain, blood in stool, constipation, heartburn and nausea.  Genitourinary: Negative for dysuria and urgency.  Musculoskeletal: Positive for back pain. Negative for joint pain and myalgias.  Neurological: Negative for dizziness and headaches.  Endo/Heme/Allergies: Does not bruise/bleed easily.  Psychiatric/Behavioral: Negative for depression. The patient is not nervous/anxious and does not have insomnia.     Allergies  Allergen Reactions  . Lisinopril Swelling    Lip angioedema    Past Medical History:  Diagnosis Date  . Abnormal mammogram 11/29/2012   Repeat left diagnostic September 2014   . 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 Essential hypertension, benign  12/22/2012  . Obesity 11/29/2012  . Type 2 diabetes mellitus (HCC) 08/06/2014    Past Surgical History:  Procedure Laterality Date  . REDUCTION MAMMAPLASTY Bilateral 2010    Social History   Socioeconomic History  . Marital status: Single    Spouse name: Not on file  . Number of children: Not on file  . Years of education: Not on file  . Highest education level: Not on file  Occupational History  . Not on file  Tobacco Use  . Smoking status: Never Smoker  . Smokeless tobacco: Never Used  Substance and Sexual Activity  . Alcohol use: Yes    Comment: once a month  . Drug use: No  . Sexual activity: Yes  Other Topics Concern  . Not on file  Social History Narrative  . Not on file   Social Determinants of Health   Financial Resource Strain:   . Difficulty of Paying Living Expenses: Not on file  Food Insecurity:   . Worried About Programme researcher, broadcasting/film/video in the Last Year: Not on file  . Ran Out of Food in the Last Year: Not on file  Transportation Needs:   . Lack of Transportation (Medical): Not on file  . Lack of Transportation (Non-Medical): Not on file  Physical Activity:   . Days of Exercise per Week: Not on file  . Minutes of Exercise per Session: Not on file  Stress:   . Feeling of Stress : Not on file  Social Connections:   . Frequency of Communication  with Friends and Family: Not on file  . Frequency of Social Gatherings with Friends and Family: Not on file  . Attends Religious Services: Not on file  . Active Member of Clubs or Organizations: Not on file  . Attends Banker Meetings: Not on file  . Marital Status: Not on file    Family History  Problem Relation Age of Onset  . Pancreatic cancer Mother   . Hypertension Mother        father  . Hypertension Father   . Breast cancer Cousin     Health Maintenance  Topic Date Due  . Hepatitis C Screening  Never done  . OPHTHALMOLOGY EXAM  09/21/2019  . FOOT EXAM  06/05/2020  . HEMOGLOBIN A1C   07/11/2020  . PAP SMEAR-Modifier  07/14/2021  . TETANUS/TDAP  12/23/2022  . INFLUENZA VACCINE  Completed  . PNEUMOCOCCAL POLYSACCHARIDE VACCINE AGE 49-64 HIGH RISK  Completed  . COVID-19 Vaccine  Completed  . HIV Screening  Completed     ----------------------------------------------------------------------------------------------------------------------------------------------------------------------------------------------------------------- Physical Exam BP (!) 143/83 (BP Location: Left Arm, Patient Position: Sitting, Cuff Size: Large)   Pulse (!) 102   Temp 99.2 F (37.3 C) (Oral)   Wt (!) 334 lb 1.3 oz (151.5 kg)   BMI 49.33 kg/m   Physical Exam Constitutional:      General: She is not in acute distress. HENT:     Head: Normocephalic and atraumatic.     Right Ear: Tympanic membrane normal.     Left Ear: Tympanic membrane normal.     Nose: Nose normal.  Eyes:     General: No scleral icterus.    Conjunctiva/sclera: Conjunctivae normal.  Neck:     Thyroid: No thyromegaly.  Cardiovascular:     Rate and Rhythm: Normal rate and regular rhythm.     Heart sounds: Normal heart sounds.  Pulmonary:     Effort: Pulmonary effort is normal.     Breath sounds: Normal breath sounds.  Abdominal:     General: Bowel sounds are normal. There is no distension.     Palpations: Abdomen is soft.     Tenderness: There is no abdominal tenderness. There is no guarding.  Musculoskeletal:        General: Normal range of motion.     Cervical back: Normal range of motion and neck supple.  Lymphadenopathy:     Cervical: No cervical adenopathy.  Skin:    General: Skin is warm and dry.     Findings: No rash.  Neurological:     Mental Status: She is alert and oriented to person, place, and time.     Cranial Nerves: No cranial nerve deficit.     Coordination: Coordination normal.  Psychiatric:        Mood and Affect: Mood normal.        Behavior: Behavior normal.      ------------------------------------------------------------------------------------------------------------------------------------------------------------------------------------------------------------------- Assessment and Plan  Well adult exam Well adult Orders Placed This Encounter  Procedures  . Flu Vaccine QUAD 6+ mos PF IM (Fluarix Quad PF)  . COMPLETE METABOLIC PANEL WITH GFR  . CBC  . TSH  . FSH/LH  . HgB A1c  . Lipid Profile  Screening: Reminded to schedule mammogram.  Immunizations: Flu vaccine given today.  Anticipatory guidance/Risk factor reduction:  Recommendations per AVS   Lumbar spondylosis Continued back pain.  Will try changing to relafen and robaxin.  If not improving recommend MRI vs f/u with Dr. Karie Schwalbe  Abnormal menses Check TSH, FSH,  LH   Meds ordered this encounter  Medications  . nabumetone (RELAFEN) 750 MG tablet    Sig: Take 1 tablet (750 mg total) by mouth daily.    Dispense:  30 tablet    Refill:  1  . methocarbamol (ROBAXIN-750) 750 MG tablet    Sig: Take 1 tablet (750 mg total) by mouth every 8 (eight) hours as needed for muscle spasms.    Dispense:  30 tablet    Refill:  1    Return in about 6 months (around 12/10/2020) for HTN/DM.    This visit occurred during the SARS-CoV-2 public health emergency.  Safety protocols were in place, including screening questions prior to the visit, additional usage of staff PPE, and extensive cleaning of exam room while observing appropriate contact time as indicated for disinfecting solutions.

## 2020-06-11 NOTE — Patient Instructions (Addendum)
Try nabumetone to replace naproxen.  Try methocarbamol to replace cyclobenzaprine.   If not seeing improvement in 2-3 weeks lets have you see Dr. Karie Schwalbe.   Have labs completed.  See me again in 6 months.    Preventive Care 46-49 Years Old, Female Preventive care refers to visits with your health care provider and lifestyle choices that can promote health and wellness. This includes:  A yearly physical exam. This may also be called an annual well check.  Regular dental visits and eye exams.  Immunizations.  Screening for certain conditions.  Healthy lifestyle choices, such as eating a healthy diet, getting regular exercise, not using drugs or products that contain nicotine and tobacco, and limiting alcohol use. What can I expect for my preventive care visit? Physical exam Your health care provider will check your:  Height and weight. This may be used to calculate body mass index (BMI), which tells if you are at a healthy weight.  Heart rate and blood pressure.  Skin for abnormal spots. Counseling Your health care provider may ask you questions about your:  Alcohol, tobacco, and drug use.  Emotional well-being.  Home and relationship well-being.  Sexual activity.  Eating habits.  Work and work Astronomer.  Method of birth control.  Menstrual cycle.  Pregnancy history. What immunizations do I need?  Influenza (flu) vaccine  This is recommended every year. Tetanus, diphtheria, and pertussis (Tdap) vaccine  You may need a Td booster every 10 years. Varicella (chickenpox) vaccine  You may need this if you have not been vaccinated. Zoster (shingles) vaccine  You may need this after age 30. Measles, mumps, and rubella (MMR) vaccine  You may need at least one dose of MMR if you were born in 1957 or later. You may also need a second dose. Pneumococcal conjugate (PCV13) vaccine  You may need this if you have certain conditions and were not previously  vaccinated. Pneumococcal polysaccharide (PPSV23) vaccine  You may need one or two doses if you smoke cigarettes or if you have certain conditions. Meningococcal conjugate (MenACWY) vaccine  You may need this if you have certain conditions. Hepatitis A vaccine  You may need this if you have certain conditions or if you travel or work in places where you may be exposed to hepatitis A. Hepatitis B vaccine  You may need this if you have certain conditions or if you travel or work in places where you may be exposed to hepatitis B. Haemophilus influenzae type b (Hib) vaccine  You may need this if you have certain conditions. Human papillomavirus (HPV) vaccine  If recommended by your health care provider, you may need three doses over 6 months. You may receive vaccines as individual doses or as more than one vaccine together in one shot (combination vaccines). Talk with your health care provider about the risks and benefits of combination vaccines. What tests do I need? Blood tests  Lipid and cholesterol levels. These may be checked every 5 years, or more frequently if you are over 47 years old.  Hepatitis C test.  Hepatitis B test. Screening  Lung cancer screening. You may have this screening every year starting at age 53 if you have a 30-pack-year history of smoking and currently smoke or have quit within the past 15 years.  Colorectal cancer screening. All adults should have this screening starting at age 33 and continuing until age 63. Your health care provider may recommend screening at age 48 if you are at increased risk. You  will have tests every 1-10 years, depending on your results and the type of screening test.  Diabetes screening. This is done by checking your blood sugar (glucose) after you have not eaten for a while (fasting). You may have this done every 1-3 years.  Mammogram. This may be done every 1-2 years. Talk with your health care provider about when you should start  having regular mammograms. This may depend on whether you have a family history of breast cancer.  BRCA-related cancer screening. This may be done if you have a family history of breast, ovarian, tubal, or peritoneal cancers.  Pelvic exam and Pap test. This may be done every 3 years starting at age 21. Starting at age 30, this may be done every 5 years if you have a Pap test in combination with an HPV test. Other tests  Sexually transmitted disease (STD) testing.  Bone density scan. This is done to screen for osteoporosis. You may have this scan if you are at high risk for osteoporosis. Follow these instructions at home: Eating and drinking  Eat a diet that includes fresh fruits and vegetables, whole grains, lean protein, and low-fat dairy.  Take vitamin and mineral supplements as recommended by your health care provider.  Do not drink alcohol if: ? Your health care provider tells you not to drink. ? You are pregnant, may be pregnant, or are planning to become pregnant.  If you drink alcohol: ? Limit how much you have to 0-1 drink a day. ? Be aware of how much alcohol is in your drink. In the [place] one drink equals one 12 oz bottle of beer (355 mL), one 5 oz glass of wine (148 mL), or one 1 oz glass of hard liquor (44 mL). Lifestyle  Take daily care of your teeth and gums.  Stay active. Exercise for at least 30 minutes on 5 or more days each week.  Do not use any products that contain nicotine or tobacco, such as cigarettes, e-cigarettes, and chewing tobacco. If you need help quitting, ask your health care provider.  If you are sexually active, practice safe sex. Use a condom or other form of birth control (contraception) in order to prevent pregnancy and STIs (sexually transmitted infections).  If told by your health care provider, take low-dose aspirin daily starting at age 50. What's next?  Visit your health care provider once a year for a well check visit.  Ask your health  care provider how often you should have your eyes and teeth checked.  Stay up to date on all vaccines. This information is not intended to replace advice given to you by your health care provider. Make sure you discuss any questions you have with your health care provider. Document Revised: 04/27/2018 Document Reviewed: 04/27/2018 Elsevier Patient Education  2020 Elsevier Inc[employer]  

## 2020-06-11 NOTE — Assessment & Plan Note (Signed)
Well adult Orders Placed This Encounter  Procedures  . Flu Vaccine QUAD 6+ mos PF IM (Fluarix Quad PF)  . COMPLETE METABOLIC PANEL WITH GFR  . CBC  . TSH  . FSH/LH  . HgB A1c  . Lipid Profile  Screening: Reminded to schedule mammogram.  Immunizations: Flu vaccine given today.  Anticipatory guidance/Risk factor reduction:  Recommendations per AVS

## 2020-06-11 NOTE — Assessment & Plan Note (Signed)
Check TSH, FSH, LH

## 2020-06-12 LAB — COMPLETE METABOLIC PANEL WITH GFR
AG Ratio: 1.4 (calc) (ref 1.0–2.5)
ALT: 26 U/L (ref 6–29)
AST: 16 U/L (ref 10–35)
Albumin: 4.1 g/dL (ref 3.6–5.1)
Alkaline phosphatase (APISO): 68 U/L (ref 31–125)
BUN: 20 mg/dL (ref 7–25)
CO2: 27 mmol/L (ref 20–32)
Calcium: 9.1 mg/dL (ref 8.6–10.2)
Chloride: 104 mmol/L (ref 98–110)
Creat: 0.84 mg/dL (ref 0.50–1.10)
GFR, Est African American: 95 mL/min/{1.73_m2} (ref >=60)
GFR, Est Non African American: 82 mL/min/{1.73_m2} (ref >=60)
Globulin: 2.9 g/dL (calc) (ref 1.9–3.7)
Glucose, Bld: 126 mg/dL — ABNORMAL HIGH (ref 65–99)
Potassium: 4.2 mmol/L (ref 3.5–5.3)
Sodium: 139 mmol/L (ref 135–146)
Total Bilirubin: 0.5 mg/dL (ref 0.2–1.2)
Total Protein: 7 g/dL (ref 6.1–8.1)

## 2020-06-12 LAB — CBC
HCT: 37.1 % (ref 35.0–45.0)
Hemoglobin: 11.8 g/dL (ref 11.7–15.5)
MCH: 26.9 pg — ABNORMAL LOW (ref 27.0–33.0)
MCHC: 31.8 g/dL — ABNORMAL LOW (ref 32.0–36.0)
MCV: 84.5 fL (ref 80.0–100.0)
MPV: 10.7 fL (ref 7.5–12.5)
Platelets: 330 10*3/uL (ref 140–400)
RBC: 4.39 10*6/uL (ref 3.80–5.10)
RDW: 14.8 % (ref 11.0–15.0)
WBC: 10.6 10*3/uL (ref 3.8–10.8)

## 2020-06-12 LAB — LIPID PANEL
Cholesterol: 140 mg/dL (ref <200)
HDL: 51 mg/dL (ref >=50)
LDL Cholesterol (Calc): 71 mg/dL (calc)
Non-HDL Cholesterol (Calc): 89 mg/dL (calc) (ref <130)
Total CHOL/HDL Ratio: 2.7 (calc) (ref <5.0)
Triglycerides: 93 mg/dL (ref <150)

## 2020-06-12 LAB — TSH: TSH: 1.02 mIU/L

## 2020-06-12 LAB — FSH/LH
FSH: 5.2 m[IU]/mL
LH: 4.1 m[IU]/mL

## 2020-06-12 LAB — HEMOGLOBIN A1C
Hgb A1c MFr Bld: 6.6 % of total Hgb — ABNORMAL HIGH (ref <5.7)
Mean Plasma Glucose: 143 (calc)
eAG (mmol/L): 7.9 (calc)

## 2020-07-03 ENCOUNTER — Other Ambulatory Visit: Payer: Self-pay | Admitting: Family Medicine

## 2020-08-04 ENCOUNTER — Other Ambulatory Visit: Payer: Self-pay | Admitting: Family Medicine

## 2020-09-09 ENCOUNTER — Other Ambulatory Visit: Payer: Self-pay | Admitting: Family Medicine

## 2020-09-22 ENCOUNTER — Other Ambulatory Visit: Payer: Self-pay

## 2020-09-22 ENCOUNTER — Ambulatory Visit (INDEPENDENT_AMBULATORY_CARE_PROVIDER_SITE_OTHER): Payer: No Typology Code available for payment source

## 2020-09-22 ENCOUNTER — Ambulatory Visit (INDEPENDENT_AMBULATORY_CARE_PROVIDER_SITE_OTHER): Payer: No Typology Code available for payment source | Admitting: Sports Medicine

## 2020-09-22 DIAGNOSIS — M47816 Spondylosis without myelopathy or radiculopathy, lumbar region: Secondary | ICD-10-CM | POA: Diagnosis not present

## 2020-09-22 DIAGNOSIS — M4126 Other idiopathic scoliosis, lumbar region: Secondary | ICD-10-CM

## 2020-09-22 DIAGNOSIS — M5136 Other intervertebral disc degeneration, lumbar region: Secondary | ICD-10-CM

## 2020-09-22 DIAGNOSIS — M533 Sacrococcygeal disorders, not elsewhere classified: Secondary | ICD-10-CM

## 2020-09-22 MED ORDER — CYCLOBENZAPRINE HCL 10 MG PO TABS: ORAL_TABLET | ORAL | 0 refills | Status: DC

## 2020-09-22 MED ORDER — TRAMADOL HCL 50 MG PO TABS: 50.0000 mg | ORAL_TABLET | Freq: Three times a day (TID) | ORAL | 0 refills | Status: DC | PRN

## 2020-09-22 MED ORDER — PREDNISONE 50 MG PO TABS: ORAL_TABLET | ORAL | 0 refills | Status: DC

## 2020-09-22 NOTE — Progress Notes (Signed)
    Procedures performed today:    None.  Independent interpretation of notes and tests performed by another provider:   None.  Brief History, Exam, Impression, and Recommendations:    Lumbar spondylosis This is a pleasant 50 year old female with a long history of low back pain, left-sided with radiation down to the buttock and thigh on the left, nothing overtly radicular, no red flag symptoms. Clinically she has pain at the left sacroiliac joint, worse with standing and walking. We Splane the anatomy, anthropology and evolution, she understands the process, adding 5 days of prednisone, Flexeril, tramadol, formal physical therapy, x-rays, return to see me in 4 weeks, SI joint injection if no better.    ___________________________________________ Ihor Austin. Benjamin Stain, M.D., ABFM., CAQSM. Primary Care and Sports Medicine [facility]  Adjunct Instructor of Family Medicine  [employer]

## 2020-09-22 NOTE — Assessment & Plan Note (Signed)
This is a pleasant 50 year old female with a long history of low back pain, left-sided with radiation down to the buttock and thigh on the left, nothing overtly radicular, no red flag symptoms. Clinically she has pain at the left sacroiliac joint, worse with standing and walking. We Splane the anatomy, anthropology and evolution, she understands the process, adding 5 days of prednisone, Flexeril, tramadol, formal physical therapy, x-rays, return to see me in 4 weeks, SI joint injection if no better.

## 2020-09-29 MED ORDER — HYDROCODONE-ACETAMINOPHEN 5-325 MG PO TABS: 1.0000 | ORAL_TABLET | Freq: Three times a day (TID) | ORAL | 0 refills | Status: DC | PRN

## 2020-10-01 ENCOUNTER — Ambulatory Visit (INDEPENDENT_AMBULATORY_CARE_PROVIDER_SITE_OTHER): Payer: No Typology Code available for payment source | Admitting: Physical Therapy

## 2020-10-01 ENCOUNTER — Encounter: Payer: Self-pay | Admitting: Physical Therapy

## 2020-10-01 ENCOUNTER — Other Ambulatory Visit: Payer: Self-pay

## 2020-10-01 ENCOUNTER — Encounter: Payer: Self-pay | Admitting: Sports Medicine

## 2020-10-01 DIAGNOSIS — M25652 Stiffness of left hip, not elsewhere classified: Secondary | ICD-10-CM

## 2020-10-01 DIAGNOSIS — M6281 Muscle weakness (generalized): Secondary | ICD-10-CM

## 2020-10-01 DIAGNOSIS — M5442 Lumbago with sciatica, left side: Secondary | ICD-10-CM | POA: Diagnosis not present

## 2020-10-01 DIAGNOSIS — G8929 Other chronic pain: Secondary | ICD-10-CM

## 2020-10-01 DIAGNOSIS — M62838 Other muscle spasm: Secondary | ICD-10-CM

## 2020-10-01 NOTE — Patient Instructions (Signed)
Access Code: [email or web address] URL: [email or web address] Date: 10/01/2020 Prepared by: Roderic Scarce  Exercises Half Kneeling Hip Flexor Stretch - 1 x daily - 3 reps - 30-45sec hold Prone Quadriceps Stretch with Strap - 1 x daily - 3 reps - 30-45 sec hold Supine Transversus Abdominis Bracing - Hands on Stomach - 1 x daily - 10 reps - 5 sec hold Standing Glute Med Mobilization with Small Ball on Wall - 30-60 sec hold  Patient Education Trigger Point Dry Needling TENS Unit TENS Therapy

## 2020-10-01 NOTE — Therapy (Signed)
[facility] [place] [address] [place] [place], [place], [address] Phone: [phone]   Fax:  [phone]  Physical Therapy Evaluation  Patient Details  Name: Sheila Gutierrez MRN: [id number] Date of Birth: 08/07/1971 Referring Provider (PT): Dr Benjamin Stain   Encounter Date: 10/01/2020   PT End of Session - 10/01/20 0837    Visit Number 1    Number of Visits 8    Date for PT Re-Evaluation 11/26/20    Authorization Type aetna - no ionto    PT Start Time [phone]    PT Stop Time 0943    PT Time Calculation (min) 65 min    Activity Tolerance Patient tolerated treatment well    Behavior During Therapy [facility] for tasks assessed/performed           Past Medical History:  Diagnosis Date  . Abnormal mammogram 11/29/2012   Repeat left diagnostic September 2014   . 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 Essential hypertension, benign 12/22/2012  . Obesity 11/29/2012  . Type 2 diabetes mellitus (HCC) 08/06/2014    Past Surgical History:  Procedure Laterality Date  . REDUCTION MAMMAPLASTY Bilateral 2010    There were no vitals filed for this visit.    Subjective Assessment - 10/01/20 0839    Subjective Pt reports her pain is intermittent, the most recent episode flared up about 3-4 wks ago.   She is doing some stretching and tries to get into the water. Was getting massages - had to stop because they were to painful    How long can you stand comfortably? has to move a lot    How long can you walk comfortably? 200'    Diagnostic tests xrays - spondylosis    Patient Stated Goals walk long distances with out having to rest    Currently in Pain? Yes    Pain Score 7     Pain Location Buttocks    Pain Orientation Left   sometimes moves to the right   Pain Descriptors / Indicators Dull    Pain Type Chronic pain    Pain Radiating Towards down to Lt ankle at times - occurs daily    Pain Onset More than a  month ago    Aggravating Factors  standing up straight , walking from office to car    Pain Relieving Factors getting off feet , lying down, sometimes heat/ice              [facility] PT Assessment - 10/01/20 0001      Assessment   Medical Diagnosis lumbar spondylosis with Lt radiculopathy    Referring Provider (PT) Dr Benjamin Stain    Onset Date/Surgical Date 02/27/18    Hand Dominance Right    Prior Therapy yes, one visit in 2019      Precautions   Precautions None      Balance Screen   Has the patient fallen in the past 6 months No    Has the patient had a decrease in activity level because of a fear of falling?  No    Is the patient reluctant to leave their home because of a fear of falling?  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    Leisure pool  - for exercise- walking and class      Observation/Other Assessments   Focus on Therapeutic Outcomes (FOTO)  33      Sensation   Light Touch Appears Intact    Hot/Cold Appears Intact      Posture/Postural Control   Posture/Postural Control Postural limitations    Postural Limitations --   obese   Posture Comments anterior pelvic tilt      ROM / Strength   AROM / PROM / Strength AROM;Strength      AROM   AROM Assessment Site Hip;Lumbar    Lumbar Flexion to floor    Lumbar Extension 10% present with Lt buttock pain    Lumbar - Right Rotation WNL    Lumbar - Left Rotation WNL      Strength   Strength Assessment Site Hip;Knee;Ankle    Right/Left Hip Left   Rt 5/5   Left Hip Flexion 5/5    Left Hip Extension 4+/5    Left Hip ABduction 4-/5    Right/Left Knee --   5/5   Right/Left Ankle --   Rt 5/5, Lt 4/5 DF      Flexibility   Soft Tissue Assessment /Muscle Length yes    Hamstrings WFL's bilat     Quadriceps prone knee flex Lt 108, Rt 124   tight hip flexors bilat     Palpation   Spinal mobility NA at eval    Palpation comment tight and tender in Lt gluts, QL and lumbar paraspinals      Special Tests   Other special tests (-) SLR                      Objective measurements completed on examination: See above findings.       [facility] Adult PT Treatment/Exercise - 10/01/20 0001      Self-Care   Self-Care Other Self-Care Comments    Other Self-Care Comments  self trigger point release with ball on wall for gluts      Exercises   Exercises Lumbar      Lumbar Exercises: Stretches   Hip Flexor Stretch Left;30 seconds   in low kneel   Quad Stretch Left;60 seconds   prone     Modalities   Modalities Electrical Stimulation;Moist Heat      Moist Heat Therapy   Number Minutes Moist Heat 15 Minutes    Moist Heat Location --   Lt buttock and lumbar     Electrical Stimulation   Electrical Stimulation Location Lt buttock and lumbar    Electrical Stimulation Action IFC    Electrical Stimulation Parameters to tolerance    Electrical Stimulation Goals Tone;Pain                  PT Education - 10/01/20 1028    Education Details HEP, FOTO, POC    Person(s) Educated Patient    Methods Explanation;Demonstration;Handout    Comprehension Returned demonstration;Verbalized understanding            PT Short Term Goals - 10/01/20 1047      PT SHORT TERM GOAL #1   Title I with initial HEP    Time 4    Period Weeks    Status New    Target Date 10/29/20      PT SHORT TERM  GOAL #2   Title [facility] with no more than 2/10 pain in her back/buttocks    Time 4    Period Weeks    Status New    Target Date 10/29/20      PT SHORT TERM GOAL #3   Title begin walking program    Time 4    Period Weeks    Status New    Target Date  10/29/20             PT Long Term Goals - 10/01/20 1048      PT LONG TERM GOAL #1   Title Decrease pain in the Lt LB by 75-90% allowing patient to perfrom normal functional activities without pain    Time 8    Period Weeks    Status New    Target Date 11/26/20      PT LONG TERM GOAL #2   Title Increase trunk and LE ROM/mobility to WFL's throughout    Time 8    Period Weeks    Status New    Target Date 11/26/20      PT LONG TERM GOAL #3   Title improve Lt hip strength to = Rt to allow tolerance of HEP    Time 8    Period Weeks    Status New    Target Date 11/26/20      PT LONG TERM GOAL #4   Title report low back and buttock pain to baseline to allow her to perform her ADLs per her previous level    Time 8    Period Weeks    Status New    Target Date 11/26/20      PT LONG TERM GOAL #5   Title Improve FOTO to 46    Time 8    Period Weeks    Status New    Target Date 11/26/20                  Plan - 10/01/20 1039    Clinical Impression Statement 50 yo female with long h/o of intermittent back problems.  This most recent flare up was about a month ago with no known cause.  She is currently working from home, desk work.  She had one PT visit in 2019 and did not return.  Winnifred fiinished a 5 day prednisone dose pack without any relief and is referred to PT to help decrease her pain and restore her PLOF.  She would like to return to her water classes at the [facility] and start a walking program to help with weight loss.  Lumbar ROM is WNL except extension, this is very limited by pain.  She has very tight hip flexors and Lt quad pulling her into anterior pelvic tilt in standing.  She has some weakness in the Lt hip and a lot of tightness and tenderness in the Lt gluts, piriformis, QL and lumbar paraspinals.  She would benefit from DN, however she is not sure she wants this at first.  Needs manaul therapy to decrease tightness, hip flexibility to take pull off pelvis and  low back and strengthening , education for safe ways to participate in a walking program and her water areobics.  [place]ika is limited in attendance due to finances and high copay for therapy.  Will start with once a week and reassess as needed. Patient issued information on home TENS until    Personal Factors and Comorbidities Behavior  Pattern;Comorbidity 2;Time since onset of injury/illness/exacerbation    Comorbidities see snapshot    Examination-Activity Limitations Bathing;Locomotion Level;Dressing;Stairs;Stand    Examination-Participation Restrictions Community Activity;Other    Stability/Clinical Decision Making Evolving/Moderate complexity    Clinical Decision Making Moderate    Rehab Potential Fair    PT Frequency 1x / week    PT Duration 8 weeks    PT Treatment/Interventions Taping;Patient/family education;Functional mobility training;Moist Heat;Traction;Therapeutic activities;Spinal Manipulations;Dry needling;Therapeutic exercise;Cryotherapy;Electrical Stimulation;Manual techniques    PT Next Visit Plan core strength, hip flexor mobility, instruction in safe water exercises and manual work to include DN if she wants it    PT Home Exercise Plan [email or web address]    Consulted and Agree with Plan of Care Patient           Patient will benefit from skilled therapeutic intervention in order to improve the following deficits and impairments:  Decreased range of motion,Obesity,Increased muscle spasms,Pain,Impaired flexibility,Decreased strength,Postural dysfunction  Visit Diagnosis: Chronic left-sided low back pain with left-sided sciatica - Plan: PT plan of care cert/re-cert  Muscle weakness (generalized) - Plan: PT plan of care cert/re-cert  Stiffness of left hip, not elsewhere classified - Plan: PT plan of care cert/re-cert  Other muscle spasm - Plan: PT plan of care cert/re-cert     Problem List Patient Active Problem List   Diagnosis Date Noted  . Well adult exam 06/11/2020  .  Abnormal menses 06/11/2020  . Lumbar spondylosis 01/09/2020  . 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 Transaminitis 07/27/2017  . Type 2 diabetes mellitus (HCC) 08/06/2014  . Essential hypertension, benign 12/22/2012  . Abnormal mammogram 11/29/2012  . History of bilateral breast reduction surgery 11/29/2012  . Morbid obesity (HCC) 11/29/2012  . History of abnormal Pap smear 11/29/2012    Roderic Scarce PT  10/01/2020, 10:53 AM  [facility] [address] [place], [place], [address] Phone: [phone]   Fax:  [phone]  Name: Sheila Gutierrez MRN: [id number] Date of Birth: 1971-05-09

## 2020-10-06 ENCOUNTER — Other Ambulatory Visit: Payer: Self-pay

## 2020-10-06 ENCOUNTER — Other Ambulatory Visit: Payer: Self-pay | Admitting: Family Medicine

## 2020-10-08 ENCOUNTER — Ambulatory Visit (INDEPENDENT_AMBULATORY_CARE_PROVIDER_SITE_OTHER): Payer: No Typology Code available for payment source | Admitting: Physical Therapy

## 2020-10-08 ENCOUNTER — Other Ambulatory Visit: Payer: Self-pay

## 2020-10-08 DIAGNOSIS — M62838 Other muscle spasm: Secondary | ICD-10-CM | POA: Diagnosis not present

## 2020-10-08 DIAGNOSIS — M5442 Lumbago with sciatica, left side: Secondary | ICD-10-CM

## 2020-10-08 DIAGNOSIS — M25652 Stiffness of left hip, not elsewhere classified: Secondary | ICD-10-CM

## 2020-10-08 DIAGNOSIS — M6281 Muscle weakness (generalized): Secondary | ICD-10-CM

## 2020-10-08 DIAGNOSIS — G8929 Other chronic pain: Secondary | ICD-10-CM

## 2020-10-08 LAB — HM DIABETES EYE EXAM

## 2020-10-08 NOTE — Patient Instructions (Addendum)
  Access Code: [email or web address] URL: [email or web address] Date: 10/08/2020 Prepared by: [facility]  Exercises Prone Quadriceps Stretch with Strap - 1 x daily - 3 reps - 30-45 sec hold Supine Transversus Abdominis Bracing - Hands on Stomach - 1 x daily - 10 reps - 5 sec hold Standing Glute Med Mobilization with Small Ball on Wall - 30-60 sec hold Seated Piriformis Stretch - 2 x daily - 7 x weekly - 1 sets - 2 reps - 15 seconds hold Standing 'L' Stretch at [employer] - 2 x daily - 7 x weekly - 1 sets - 3 reps - 10 hold

## 2020-10-08 NOTE — Therapy (Addendum)
[place], [place], [address] Phone: [phone]   Fax:  [phone]  Physical Therapy Treatment and Discharge  Patient Details  Name: Sheila Gutierrez MRN: [id number] Date of Birth: Apr 06, 1971 Referring Provider (PT): Dr Dianah Field   Encounter Date: 10/08/2020   PT End of Session - 10/08/20 1105    Visit Number 2    Number of Visits 8    Date for PT Re-Evaluation 11/26/20    Authorization Type aetna - no ionto    PT Start Time 1019    PT Stop Time 1104    PT Time Calculation (min) 45 min    Activity Tolerance Patient tolerated treatment well    Behavior During Therapy [facility] for tasks assessed/performed           Past Medical History:  Diagnosis Date  . Abnormal mammogram 11/29/2012   Repeat left diagnostic September 2014   . 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 Essential hypertension, benign 12/22/2012  . Obesity 11/29/2012  . Type 2 diabetes mellitus ([place]) 08/06/2014    Past Surgical History:  Procedure Laterality Date  . REDUCTION MAMMAPLASTY Bilateral 2010    There were no vitals filed for this visit.   Subjective Assessment - 10/08/20 1026    Subjective Pt reports mild relief with HEP stretches.  [facility]    Diagnostic tests xrays - spondylosis    Patient Stated Goals walk long distances with out having to rest    Currently in Pain? Yes    Pain Score 7     Pain Location Back    Pain Orientation Left    Pain Radiating Towards to Lt buttocks.    Aggravating Factors  standing up straight    Pain Relieving Factors lying on back with knees bent              [facility] PT Assessment - 10/08/20 0001      Assessment   Medical Diagnosis lumbar spondylosis with Lt radiculopathy    Referring Provider (PT) Dr Dianah Field    Onset Date/Surgical Date 02/27/18    Hand Dominance Right    Prior Therapy yes, one  visit in 2019            [facility] Adult PT Treatment/Exercise - 10/08/20 0001      Bed Mobility   Bed Mobility --   reviewed sit to/from supine via log roll.     Lumbar Exercises: Stretches   Passive Hamstring Stretch Left;1 rep;20 seconds   seated with hip hinge; not tolerated.   Hip Flexor Stretch Left;2 reps;Right;1 rep;30 seconds   seated   Hip Flexor Stretch Limitations Lt hip pain during Rt hip stretch    Prone on Elbows Stretch 1 rep;10 seconds   increases pain at Lt SI joint   Quad Stretch Left;2 reps;30 seconds   prone with strap   Quad Stretch Limitations some increase in glute pain.    Piriformis Stretch Left;2 reps;20 seconds;Right;1 rep;10 seconds    Piriformis Stretch Limitations seated; trial in modified pigeon pose.    Other Lumbar Stretch Exercise L stretch at counter x 3 reps;  seated shoulder flexion table slide with lateral trunk flexion for Lt side stretch    Other Lumbar Stretch Exercise Rt sidelying over black bolster for Lt side stretch (arm overhead) x 2 min.      Lumbar Exercises: Seated   Sit to Stand --  3 reps with core engaged.   Other Seated Lumbar Exercises TA/ lap press x 5 sec x 3 reps      Lumbar Exercises: Supine   Ab Set 5 reps;5 seconds    AB Set Limitations tactile cues to initiate; cues to not hold breath      Lumbar Exercises: Sidelying   Clam Limitations LLE - 3 reps for trial.      Manual Therapy   Manual Therapy Soft tissue mobilization    Soft tissue mobilization STM to Lt piriformis and glute med                    PT Short Term Goals - 10/01/20 1047      PT SHORT TERM GOAL #1   Title I with initial HEP    Time 4    Period Weeks    Status New    Target Date 10/29/20      PT SHORT TERM GOAL #2   Title [facility] and [facility] with no more than 2/10 pain in her back/buttocks    Time 4    Period Weeks    Status New    Target Date 10/29/20      PT SHORT TERM GOAL #3   Title begin walking  program    Time 4    Period Weeks    Status New    Target Date 10/29/20             PT Long Term Goals - 10/01/20 1048      PT LONG TERM GOAL #1   Title Decrease pain in the Lt LB by 75-90% allowing patient to perfrom normal functional activities without pain    Time 8    Period Weeks    Status New    Target Date 11/26/20      PT LONG TERM GOAL #2   Title Increase trunk and LE ROM/mobility to WFL's throughout    Time 8    Period Weeks    Status New    Target Date 11/26/20      PT LONG TERM GOAL #3   Title improve Lt hip strength to = Rt to allow tolerance of HEP    Time 8    Period Weeks    Status New    Target Date 11/26/20      PT LONG TERM GOAL #4   Title report low back and buttock pain to baseline to allow her to perform her ADLs per her previous level    Time 8    Period Weeks    Status New    Target Date 11/26/20      PT LONG TERM GOAL #5   Title Improve FOTO to 46    Time 8    Period Weeks    Status New    Target Date 11/26/20                 Plan - 10/08/20 1110    Clinical Impression Statement Pt reported some initial relief with stretches over the last week, however pain increased prior to arrival to session.  Pt reported some relief of LBP/ hip pain with Rt sidelying over bolster and hooklying position.  Instructed pt on TA engagement with transitional movements with good tolerance.  Modified hip flexor stretch to seated instead of kneeling.  Discussed walking in pool as means to transition to walking program/water exercise.  Encouraged pt to get  up from desk more frequently as well as use her newly acquired TENS for pain relief.  Goals are ongoing.    Personal Factors and Comorbidities Behavior Pattern;Comorbidity 2;Time since onset of injury/illness/exacerbation    Comorbidities see snapshot    Examination-Activity Limitations Bathing;Locomotion Level;Dressing;Stairs;Stand    Examination-Participation Restrictions Community Activity;Other     Stability/Clinical Decision Making Evolving/Moderate complexity    Rehab Potential Fair    PT Frequency 1x / week    PT Duration 8 weeks    PT Treatment/Interventions Taping;Patient/family education;Functional mobility training;Moist Heat;Traction;Therapeutic activities;Spinal Manipulations;Dry needling;Therapeutic exercise;Cryotherapy;Electrical Stimulation;Manual techniques    PT Next Visit Plan assess HEP; progress as tolerated.  Add water exercises to HEP.    PT Home Exercise Plan [place] and Agree with Plan of Care Patient           Patient will benefit from skilled therapeutic intervention in order to improve the following deficits and impairments:  Decreased range of motion,Obesity,Increased muscle spasms,Pain,Impaired flexibility,Decreased strength,Postural dysfunction  Visit Diagnosis: Chronic left-sided low back pain with left-sided sciatica  Muscle weakness (generalized)  Stiffness of left hip, not elsewhere classified  Other muscle spasm     Problem List Patient Active Problem List   Diagnosis Date Noted  . Well adult exam 06/11/2020  . Abnormal menses 06/11/2020  . Lumbar spondylosis 01/09/2020  . 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 Transaminitis 07/27/2017  . Type 2 diabetes mellitus ([place]) 08/06/2014  . Essential hypertension, benign 12/22/2012  . Abnormal mammogram 11/29/2012  . History of bilateral breast reduction surgery 11/29/2012  . Morbid obesity ([place]) 11/29/2012  . History of abnormal Pap smear 11/29/2012   PHYSICAL THERAPY DISCHARGE SUMMARY  Visits from Start of Care: 2  Current functional level related to goals / functional outcomes: Some decreased pain   Remaining deficits: See above   Education / Equipment: HEP Plan: Patient agrees to discharge.  Patient goals were not met. Patient is being discharged due to not returning since the last visit.  ?????    Isabelle Course,  PT,DPT03/25/229:05 AM   Kerin Perna, PTA 10/08/20 11:33 AM  [facility] [place], [place], [address] Phone: [phone]   Fax:  [phone]  Name: Sheila Gutierrez MRN: [id number] Date of Birth: 1971/04/24

## 2020-10-15 ENCOUNTER — Encounter: Payer: No Typology Code available for payment source | Admitting: Physical Therapy

## 2020-10-22 ENCOUNTER — Ambulatory Visit: Payer: No Typology Code available for payment source | Admitting: Sports Medicine

## 2020-12-30 ENCOUNTER — Encounter: Payer: Self-pay | Admitting: Family Medicine

## 2020-12-30 DIAGNOSIS — Z1211 Encounter for screening for malignant neoplasm of colon: Secondary | ICD-10-CM

## 2021-01-10 ENCOUNTER — Other Ambulatory Visit: Payer: Self-pay | Admitting: Family Medicine

## 2021-01-12 NOTE — Telephone Encounter (Signed)
Appt has been made for 01/21/21. AM

## 2021-01-12 NOTE — Telephone Encounter (Signed)
Please schedule patient for 30-month diabetes follow-up appt. Sending 30 day refill.

## 2021-01-21 ENCOUNTER — Ambulatory Visit: Payer: No Typology Code available for payment source | Admitting: Family Medicine

## 2021-02-02 ENCOUNTER — Ambulatory Visit (INDEPENDENT_AMBULATORY_CARE_PROVIDER_SITE_OTHER): Payer: No Typology Code available for payment source | Admitting: Family Medicine

## 2021-02-02 ENCOUNTER — Encounter: Payer: Self-pay | Admitting: Family Medicine

## 2021-02-02 ENCOUNTER — Other Ambulatory Visit: Payer: Self-pay

## 2021-02-02 VITALS — BP 138/57 | HR 99 | Temp 97.6°F | Ht 71.0 in | Wt 311.0 lb

## 2021-02-02 DIAGNOSIS — I10 Essential (primary) hypertension: Secondary | ICD-10-CM | POA: Diagnosis not present

## 2021-02-02 DIAGNOSIS — M47816 Spondylosis without myelopathy or radiculopathy, lumbar region: Secondary | ICD-10-CM

## 2021-02-02 DIAGNOSIS — E119 Type 2 diabetes mellitus without complications: Secondary | ICD-10-CM | POA: Diagnosis not present

## 2021-02-02 DIAGNOSIS — Z23 Encounter for immunization: Secondary | ICD-10-CM

## 2021-02-02 LAB — POCT GLYCOSYLATED HEMOGLOBIN (HGB A1C): HbA1c, POC (controlled diabetic range): 5.8 % (ref 0.0–7.0)

## 2021-02-02 MED ORDER — METFORMIN HCL ER 500 MG PO TB24: 1500.0000 mg | ORAL_TABLET | Freq: Every day | ORAL | 1 refills | Status: DC

## 2021-02-02 NOTE — Progress Notes (Signed)
Sheila Gutierrez - 50 y.o. female MRN [id number]  Date of birth: 1971-06-12  Subjective Chief Complaint  Patient presents with  . Diabetes    HPI Sheila Gutierrez is a 50 y.o. female here today for follow up visit.   She reports that overall she is doing well.  Continues to have difficulties with back pain.  She has tried multiple things to help with this. She is working hard on weight loss and weight is down about 25 lbs since last visit in 10/21.  Her blood sugars and blood pressure have also looked better.  She is doing well with current medication and denies symptoms of hypoglycemia or hypotension.   She believes that she had pneumovax previously.  She would like shingrix vaccine. She has colonoscopy scheduled for July.   ROS:  A comprehensive ROS was completed and negative except as noted per HPI  Allergies  Allergen Reactions  . Lisinopril Swelling    Lip angioedema    Past Medical History:  Diagnosis Date  . Abnormal mammogram 11/29/2012   Repeat left diagnostic September 2014   . 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 Essential hypertension, benign 12/22/2012  . Obesity 11/29/2012  . Type 2 diabetes mellitus (HCC) 08/06/2014    Past Surgical History:  Procedure Laterality Date  . REDUCTION MAMMAPLASTY Bilateral 2010    Social History   Socioeconomic History  . Marital status: Single    Spouse name: Not on file  . Number of children: Not on file  . Years of education: Not on file  . Highest education level: Not on file  Occupational History  . Not on file  Tobacco Use  . Smoking status: Never Smoker  . Smokeless tobacco: Never Used  Substance and Sexual Activity  . Alcohol use: Yes    Comment: once a month  . Drug use: No  . Sexual activity: Yes  Other Topics Concern  . Not on file  Social History Narrative  . Not on file   Social Determinants of Health   Financial Resource Strain: Not on file  Food Insecurity:  Not on file  Transportation Needs: Not on file  Physical Activity: Not on file  Stress: Not on file  Social Connections: Not on file    Family History  Problem Relation Age of Onset  . Pancreatic cancer Mother   . Hypertension Mother        father  . Hypertension Father   . Breast cancer Cousin     Health Maintenance  Topic Date Due  . Pneumococcal Vaccine 75-33 Years old (1 of 2 - PPSV23) Never done  . Hepatitis C Screening  Never done  . COLONOSCOPY (Pts 45-25yrs Insurance coverage will need to be confirmed)  Never done  . OPHTHALMOLOGY EXAM  09/21/2019  . FOOT EXAM  06/05/2020  . INFLUENZA VACCINE  03/30/2021  . Zoster Vaccines- Shingrix (2 of 2) 03/30/2021  . MAMMOGRAM  06/26/2021  . PAP SMEAR-Modifier  07/14/2021  . HEMOGLOBIN A1C  08/04/2021  . TETANUS/TDAP  12/23/2022  . PNEUMOCOCCAL POLYSACCHARIDE VACCINE AGE 76-64 HIGH RISK  Completed  . COVID-19 Vaccine  Completed  . HIV Screening  Completed  . HPV VACCINES  Aged Out     ----------------------------------------------------------------------------------------------------------------------------------------------------------------------------------------------------------------- Physical Exam BP (!) 138/57 (BP Location: Left Arm, Patient Position: Sitting, Cuff Size: Large)   Pulse 99   Temp 97.6 F (36.4 C)   Ht 5\' 11"  (1.803 m)   Wt (!) 311  lb (141.1 kg)   SpO2 97%   BMI 43.38 kg/m   Physical Exam Constitutional:      Appearance: Normal appearance.  HENT:     Head: Normocephalic and atraumatic.  Eyes:     General: No scleral icterus. Cardiovascular:     Rate and Rhythm: Normal rate and regular rhythm.  Pulmonary:     Effort: Pulmonary effort is normal.     Breath sounds: Normal breath sounds.  Musculoskeletal:     Cervical back: Neck supple.  Neurological:     General: No focal deficit present.     Mental Status: She is alert.  Psychiatric:        Mood and Affect: Mood normal.         Behavior: Behavior normal.     ------------------------------------------------------------------------------------------------------------------------------------------------------------------------------------------------------------------- Assessment and Plan  Essential hypertension, benign Blood pressure is at goal at for age and co-morbidities.  I recommend continuation of current medications.  In addition they were instructed to follow a low sodium diet with regular exercise to help to maintain adequate control of blood pressure.    Type 2 diabetes mellitus Most recent A1c of  Lab Results  Component Value Date   HGBA1C 5.8 02/02/2021   indicates diabetes is well controlled.  she will continue metformin for now.  Counseled on healthy, low carb diet and recommend frequent activity to help with maintaining good control of blood sugars.    Lumbar spondylosis Continued back pain.  She will continue with weight loss efforts and conservative therapy.    Meds ordered this encounter  Medications  . metFORMIN (GLUCOPHAGE-XR) 500 MG 24 hr tablet    Sig: Take 3 tablets (1,500 mg total) by mouth daily with breakfast.    Dispense:  270 tablet    Refill:  1    Return in about 4 months (around 06/04/2021) for Annual exam/Shngrix #2.    This visit occurred during the SARS-CoV-2 public health emergency.  Safety protocols were in place, including screening questions prior to the visit, additional usage of staff PPE, and extensive cleaning of exam room while observing appropriate contact time as indicated for disinfecting solutions.

## 2021-02-02 NOTE — Assessment & Plan Note (Signed)
Blood pressure is at goal at for age and co-morbidities.  I recommend continuation of current medications.  In addition they were instructed to follow a low sodium diet with regular exercise to help to maintain adequate control of blood pressure.  ? ?

## 2021-02-02 NOTE — Assessment & Plan Note (Signed)
Most recent A1c of  Lab Results  Component Value Date   HGBA1C 5.8 02/02/2021   indicates diabetes is well controlled.  she will continue metformin for now.  Counseled on healthy, low carb diet and recommend frequent activity to help with maintaining good control of blood sugars.

## 2021-02-02 NOTE — Patient Instructions (Addendum)
Great to see you today! Congratulations on weight loss.  Your A1c is looking much better!  Keep up the hard work! See me again in about 4-5 months for annual exam and 2nd shingles shot

## 2021-02-02 NOTE — Assessment & Plan Note (Signed)
Continued back pain.  She will continue with weight loss efforts and conservative therapy.

## 2021-02-04 ENCOUNTER — Encounter: Payer: Self-pay | Admitting: Family Medicine

## 2021-03-25 LAB — HM COLONOSCOPY

## 2021-03-28 ENCOUNTER — Other Ambulatory Visit: Payer: Self-pay | Admitting: Family Medicine

## 2021-06-17 ENCOUNTER — Other Ambulatory Visit: Payer: Self-pay | Admitting: Family Medicine

## 2021-06-17 ENCOUNTER — Encounter: Payer: No Typology Code available for payment source | Admitting: Family Medicine

## 2021-06-17 NOTE — Telephone Encounter (Signed)
Please contact patient to schedule appt per AVS note:  Return in about 4 months (around 06/04/2021) for Annual exam/Shngrix #2  Sent 30 day refill.

## 2021-07-12 ENCOUNTER — Other Ambulatory Visit: Payer: Self-pay | Admitting: Family Medicine

## 2021-07-13 ENCOUNTER — Other Ambulatory Visit: Payer: Self-pay | Admitting: Family Medicine

## 2021-07-15 ENCOUNTER — Other Ambulatory Visit (HOSPITAL_COMMUNITY)
Admission: RE | Admit: 2021-07-15 | Discharge: 2021-07-15 | Disposition: A | Discharge status: Home / Self Care | Payer: No Typology Code available for payment source | Source: Ambulatory Visit | Attending: Family Medicine | Admitting: Family Medicine

## 2021-07-15 ENCOUNTER — Other Ambulatory Visit: Payer: Self-pay

## 2021-07-15 ENCOUNTER — Encounter: Payer: Self-pay | Admitting: Family Medicine

## 2021-07-15 ENCOUNTER — Ambulatory Visit (INDEPENDENT_AMBULATORY_CARE_PROVIDER_SITE_OTHER): Payer: No Typology Code available for payment source | Admitting: Family Medicine

## 2021-07-15 VITALS — BP 118/77 | HR 98 | Ht 71.0 in | Wt 299.0 lb

## 2021-07-15 DIAGNOSIS — Z124 Encounter for screening for malignant neoplasm of cervix: Secondary | ICD-10-CM | POA: Diagnosis present

## 2021-07-15 DIAGNOSIS — Z0000 Encounter for general adult medical examination without abnormal findings: Secondary | ICD-10-CM | POA: Diagnosis present

## 2021-07-15 DIAGNOSIS — I10 Essential (primary) hypertension: Secondary | ICD-10-CM

## 2021-07-15 DIAGNOSIS — E119 Type 2 diabetes mellitus without complications: Secondary | ICD-10-CM | POA: Diagnosis not present

## 2021-07-15 DIAGNOSIS — Z23 Encounter for immunization: Secondary | ICD-10-CM

## 2021-07-15 DIAGNOSIS — Z1231 Encounter for screening mammogram for malignant neoplasm of breast: Secondary | ICD-10-CM

## 2021-07-15 NOTE — Assessment & Plan Note (Signed)
Well adult Orders Placed This Encounter  Procedures  . MM 3D SCREEN BREAST BILATERAL    Standing Status:   Future    Standing Expiration Date:   07/15/2022    Order Specific Question:   Reason for Exam (SYMPTOM  OR DIAGNOSIS REQUIRED)    Answer:   breast cancer screening    Order Specific Question:   Is the patient pregnant?    Answer:   No    Order Specific Question:   Preferred imaging location?    Answer:   [facility]  . Varicella-zoster vaccine IM (Shingrix)  . Flu Vaccine QUAD 6+ mos PF IM (Fluarix Quad PF)  . Pneumococcal conjugate vaccine 20-valent (Prevnar 20)  . COMPLETE METABOLIC PANEL WITH GFR  . CBC with Differential  . Lipid Panel w/reflex Direct LDL  . HgB A1c  . TSH  Screenings: Per orders today.  She has had colon cancer screening. Immunizations: Influenza, Shingrix No. 2 and Prevnar 20 given today. Anticipatory guidance/risk factor reduction: Recommendations per AVS.

## 2021-07-15 NOTE — Progress Notes (Signed)
Sheila Gutierrez - 50 y.o. female MRN [id number]  Date of birth: [birth year]  Subjective Chief Complaint  Patient presents with   Gynecologic Exam    HPI Sheila Gutierrez here today for annual exam.  She reports that overall she is doing well.  She has no new concerns today.  She is due for updated Pap and mammogram.  She would also like to have updated labs ordered.  She is due for her second Shingrix, pneumonia vaccine and flu vaccine which she would like to get today.  She does exercise occasionally.  She is trying to work on dietary change.  She has a non-smoker.  She occasionally consumes alcohol.  Review of Systems  Constitutional:  Negative for chills, fever, malaise/fatigue and weight loss.  HENT:  Negative for congestion, ear pain and sore throat.   Eyes:  Negative for blurred vision, double vision and pain.  Respiratory:  Negative for cough and shortness of breath.   Cardiovascular:  Negative for chest pain and palpitations.  Gastrointestinal:  Negative for abdominal pain, blood in stool, constipation, heartburn and nausea.  Genitourinary:  Negative for dysuria and urgency.  Musculoskeletal:  Negative for joint pain and myalgias.  Neurological:  Negative for dizziness and headaches.  Endo/Heme/Allergies:  Does not bruise/bleed easily.  Psychiatric/Behavioral:  Negative for depression. The patient is not nervous/anxious and does not have insomnia.      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   Essential hypertension, benign 12/22/2012   Obesity 11/29/2012   Type 2 diabetes mellitus (HCC) 08/06/2014    Past Surgical History:  Procedure Laterality Date   REDUCTION MAMMAPLASTY Bilateral 2010    Social History   Socioeconomic History   Marital  status: Single    Spouse name: Not on file   Number of children: Not on file   Years of education: Not on file   Highest education level: Not on file  Occupational History   Not on file  Tobacco Use   Smoking status: Never   Smokeless tobacco: Never  Substance and Sexual Activity   Alcohol use: Yes    Comment: once a month   Drug use: No   Sexual activity: Yes  Other Topics Concern   Not on file  Social History Narrative   Not on file   Social Determinants of Health   Financial Resource Strain: Not on file  Food Insecurity: Not on file  Transportation Needs: Not on file  Physical Activity: Not on file  Stress: Not on file  Social Connections: Not on file    Family History  Problem Relation Age of Onset   Pancreatic cancer Mother    Hypertension Mother        father   Hypertension Father    Breast cancer Cousin     Health Maintenance  Topic Date Due   Hepatitis C Screening  Never done   COVID-19 Vaccine (4 - Booster for Pfizer series) 09/16/2020   MAMMOGRAM  06/26/2021   PAP SMEAR-Modifier  07/14/2021   HEMOGLOBIN A1C  08/04/2021   OPHTHALMOLOGY EXAM  10/08/2021   FOOT EXAM  07/15/2022   TETANUS/TDAP  12/23/2022   COLONOSCOPY (Pts 45-2yrs Insurance coverage will need to be confirmed)  03/25/2024   Pneumococcal Vaccine 19-64 Years  old  Completed   INFLUENZA VACCINE  Completed   HIV Screening  Completed   Zoster Vaccines- Shingrix  Completed   HPV VACCINES  Aged Out     ----------------------------------------------------------------------------------------------------------------------------------------------------------------------------------------------------------------- Physical Exam BP 118/77 (BP Location: Left Arm, Patient Position: Sitting, Cuff Size: Large)   Pulse 98   Ht 5\' 11"  (1.803 m)   Wt 299 lb (135.6 kg)   SpO2 98%   BMI 41.70 kg/m   Physical Exam Exam conducted with a chaperone present.  Constitutional:      General: She is not  in acute distress. HENT:     Head: Normocephalic and atraumatic.     Right Ear: Tympanic membrane and ear canal normal.     Left Ear: Tympanic membrane and ear canal normal.     Nose: Nose normal.  Eyes:     General: No scleral icterus.    Conjunctiva/sclera: Conjunctivae normal.  Neck:     Thyroid: No thyromegaly.  Cardiovascular:     Rate and Rhythm: Normal rate and regular rhythm.     Heart sounds: Normal heart sounds.  Pulmonary:     Effort: Pulmonary effort is normal.     Breath sounds: Normal breath sounds.  Abdominal:     General: Bowel sounds are normal. There is no distension.     Palpations: Abdomen is soft.     Tenderness: There is no abdominal tenderness. There is no guarding.  Genitourinary:    General: Normal vulva.     Vagina: Normal. No vaginal discharge.     Cervix: Normal.     Uterus: Normal.      Adnexa: Right adnexa normal and left adnexa normal.     Comments: Chaperoned by , CMA Musculoskeletal:        General: Normal range of motion.     Cervical back: Normal range of motion and neck supple.  Lymphadenopathy:     Cervical: No cervical adenopathy.  Skin:    General: Skin is warm and dry.     Findings: No rash.  Neurological:     General: No focal deficit present.     Mental Status: She is alert and oriented to person, place, and time.     Cranial Nerves: No cranial nerve deficit.     Coordination: Coordination normal.  Psychiatric:        Mood and Affect: Mood normal.        Behavior: Behavior normal.    ------------------------------------------------------------------------------------------------------------------------------------------------------------------------------------------------------------------- Assessment and Plan  Essential hypertension, benign Blood pressure remains well controlled.  Recommend continuation of current medication for management of hypertension.  Type 2 diabetes mellitus Updating A1c today.  Well  adult exam Well adult Orders Placed This Encounter  Procedures   MM 3D SCREEN BREAST BILATERAL    Standing Status:   Future    Standing Expiration Date:   07/15/2022    Order Specific Question:   Reason for Exam (SYMPTOM  OR DIAGNOSIS REQUIRED)    Answer:   breast cancer screening    Order Specific Question:   Is the patient pregnant?    Answer:   No    Order Specific Question:   Preferred imaging location?    Answer:   07/17/2022   Varicella-zoster vaccine IM (Shingrix)   Flu Vaccine QUAD 6+ mos PF IM (Fluarix Quad PF)   Pneumococcal conjugate vaccine 20-valent (Prevnar 20)   COMPLETE METABOLIC PANEL WITH GFR   CBC with Differential   Lipid Panel w/reflex Direct LDL  HgB A1c   TSH  Screenings: Per orders today.  She has had colon cancer screening. Immunizations: Influenza, Shingrix No. 2 and Prevnar 20 given today. Anticipatory guidance/risk factor reduction: Recommendations per AVS.   No orders of the defined types were placed in this encounter.   No follow-ups on file.    This visit occurred during the SARS-CoV-2 public health emergency.  Safety protocols were in place, including screening questions prior to the visit, additional usage of Gutierrez PPE, and extensive cleaning of exam room while observing appropriate contact time as indicated for disinfecting solutions.

## 2021-07-15 NOTE — Assessment & Plan Note (Signed)
Blood pressure remains well-controlled.  Recommend continuation of current medication for management of hypertension.   

## 2021-07-15 NOTE — Assessment & Plan Note (Signed)
Updating A1c today. ?

## 2021-07-15 NOTE — Patient Instructions (Signed)
Preventive Care 92-50 Years Old, Female Preventive care refers to lifestyle choices and visits with your health care provider that can promote health and wellness. Preventive care visits are also called wellness exams. What can I expect for my preventive care visit? Counseling Your health care provider may ask you questions about your: Medical history, including: Past medical problems. Family medical history. Pregnancy history. Current health, including: Menstrual cycle. Method of birth control. Emotional well-being. Home life and relationship well-being. Sexual activity and sexual health. Lifestyle, including: Alcohol, nicotine or tobacco, and drug use. Access to firearms. Diet, exercise, and sleep habits. Work and work Statistician. Sunscreen use. Safety issues such as seatbelt and bike helmet use. Physical exam Your health care provider will check your: Height and weight. These may be used to calculate your BMI (body mass index). BMI is a measurement that tells if you are at a healthy weight. Waist circumference. This measures the distance around your waistline. This measurement also tells if you are at a healthy weight and may help predict your risk of certain diseases, such as type 2 diabetes and high blood pressure. Heart rate and blood pressure. Body temperature. Skin for abnormal spots. What immunizations do I need? Vaccines are usually given at various ages, according to a schedule. Your health care provider will recommend vaccines for you based on your age, medical history, and lifestyle or other factors, such as travel or where you work. What tests do I need? Screening Your health care provider may recommend screening tests for certain conditions. This may include: Lipid and cholesterol levels. Diabetes screening. This is done by checking your blood sugar (glucose) after you have not eaten for a while (fasting). Pelvic exam and Pap test. Hepatitis B test. Hepatitis C  test. HIV (human immunodeficiency virus) test. STI (sexually transmitted infection) testing, if you are at risk. Lung cancer screening. Colorectal cancer screening. Mammogram. Talk with your health care provider about when you should start having regular mammograms. This may depend on whether you have a family history of breast cancer. BRCA-related cancer screening. This may be done if you have a family history of breast, ovarian, tubal, or peritoneal cancers. Bone density scan. This is done to screen for osteoporosis. Talk with your health care provider about your test results, treatment options, and if necessary, the need for more tests. Follow these instructions at home: Eating and drinking  Eat a diet that includes fresh fruits and vegetables, whole grains, lean protein, and low-fat dairy products. Take vitamin and mineral supplements as recommended by your health care provider. Do not drink alcohol if: Your health care provider tells you not to drink. You are pregnant, may be pregnant, or are planning to become pregnant. If you drink alcohol: Limit how much you have to 0-1 drink a day. Know how much alcohol is in your drink. In the [place] one drink equals one 12 oz bottle of beer (355 mL), one 5 oz glass of wine (148 mL), or one 1 oz glass of hard liquor (44 mL). Lifestyle Brush your teeth every morning and night with fluoride toothpaste. Floss one time each day. Exercise for at least 30 minutes 5 or more days each week. Do not use any products that contain nicotine or tobacco. These products include cigarettes, chewing tobacco, and vaping devices, such as e-cigarettes. If you need help quitting, ask your health care provider. Do not use drugs. If you are sexually active, practice safe sex. Use a condom or other form of protection to prevent  STIs. If you do not wish to become pregnant, use a form of birth control. If you plan to become pregnant, see your health care provider for a  prepregnancy visit. Take aspirin only as told by your health care provider. Make sure that you understand how much to take and what form to take. Work with your health care provider to find out whether it is safe and beneficial for you to take aspirin daily. Find healthy ways to manage stress, such as: Meditation, yoga, or listening to music. Journaling. Talking to a trusted person. Spending time with friends and family. Minimize exposure to UV radiation to reduce your risk of skin cancer. Safety Always wear your seat belt while driving or riding in a vehicle. Do not drive: If you have been drinking alcohol. Do not ride with someone who has been drinking. When you are tired or distracted. While texting. If you have been using any mind-altering substances or drugs. Wear a helmet and other protective equipment during sports activities. If you have firearms in your house, make sure you follow all gun safety procedures. Seek help if you have been physically or sexually abused. What's next? Visit your health care provider once a year for an annual wellness visit. Ask your health care provider how often you should have your eyes and teeth checked. Stay up to date on all vaccines. This information is not intended to replace advice given to you by your health care provider. Make sure you discuss any questions you have with your health care provider. Document Revised: 02/11/2021 Document Reviewed: 02/11/2021 Elsevier Patient Education  [employer].

## 2021-07-16 LAB — CBC WITH DIFFERENTIAL/PLATELET
Absolute Monocytes: 577 cells/uL (ref 200–950)
Basophils Absolute: 72 cells/uL (ref 0–200)
Basophils Relative: 0.7 %
Eosinophils Absolute: 206 cells/uL (ref 15–500)
Eosinophils Relative: 2 %
HCT: 38.6 % (ref 35.0–45.0)
Hemoglobin: 13.3 g/dL (ref 11.7–15.5)
Lymphs Abs: 1947 cells/uL (ref 850–3900)
MCH: 29 pg (ref 27.0–33.0)
MCHC: 34.5 g/dL (ref 32.0–36.0)
MCV: 84.3 fL (ref 80.0–100.0)
MPV: 10.3 fL (ref 7.5–12.5)
Monocytes Relative: 5.6 %
Neutro Abs: 7498 cells/uL (ref 1500–7800)
Neutrophils Relative %: 72.8 %
Platelets: 343 10*3/uL (ref 140–400)
RBC: 4.58 10*6/uL (ref 3.80–5.10)
RDW: 14 % (ref 11.0–15.0)
Total Lymphocyte: 18.9 %
WBC: 10.3 10*3/uL (ref 3.8–10.8)

## 2021-07-16 LAB — LIPID PANEL W/REFLEX DIRECT LDL
Cholesterol: 160 mg/dL (ref <200)
HDL: 58 mg/dL (ref >=50)
LDL Cholesterol (Calc): 82 mg/dL (calc)
Non-HDL Cholesterol (Calc): 102 mg/dL (calc) (ref <130)
Total CHOL/HDL Ratio: 2.8 (calc) (ref <5.0)
Triglycerides: 104 mg/dL (ref <150)

## 2021-07-16 LAB — COMPLETE METABOLIC PANEL WITH GFR
AG Ratio: 1.4 (calc) (ref 1.0–2.5)
ALT: 34 U/L — ABNORMAL HIGH (ref 6–29)
AST: 21 U/L (ref 10–35)
Albumin: 4.2 g/dL (ref 3.6–5.1)
Alkaline phosphatase (APISO): 78 U/L (ref 37–153)
BUN: 14 mg/dL (ref 7–25)
CO2: 23 mmol/L (ref 20–32)
Calcium: 9.7 mg/dL (ref 8.6–10.4)
Chloride: 103 mmol/L (ref 98–110)
Creat: 0.83 mg/dL (ref 0.50–1.03)
Globulin: 3.1 g/dL (calc) (ref 1.9–3.7)
Glucose, Bld: 83 mg/dL (ref 65–99)
Potassium: 3.5 mmol/L (ref 3.5–5.3)
Sodium: 140 mmol/L (ref 135–146)
Total Bilirubin: 0.8 mg/dL (ref 0.2–1.2)
Total Protein: 7.3 g/dL (ref 6.1–8.1)
eGFR: 86 mL/min/{1.73_m2} (ref >=60)

## 2021-07-16 LAB — HEMOGLOBIN A1C
Hgb A1c MFr Bld: 5.7 % of total Hgb — ABNORMAL HIGH (ref <5.7)
Mean Plasma Glucose: 117 mg/dL
eAG (mmol/L): 6.5 mmol/L

## 2021-07-16 LAB — TSH: TSH: 1.41 mIU/L

## 2021-07-29 ENCOUNTER — Ambulatory Visit: Payer: No Typology Code available for payment source

## 2021-07-29 LAB — CYTOLOGY - PAP
Adequacy: ABSENT
Chlamydia: NEGATIVE
Comment: NEGATIVE
Comment: NEGATIVE
Comment: NEGATIVE
Comment: NEGATIVE
Comment: NORMAL
Diagnosis: NEGATIVE
HPV 16: NEGATIVE
HPV 18 / 45: NEGATIVE
High risk HPV: POSITIVE — AB
Neisseria Gonorrhea: NEGATIVE
Trichomonas: NEGATIVE

## 2021-08-05 ENCOUNTER — Ambulatory Visit (INDEPENDENT_AMBULATORY_CARE_PROVIDER_SITE_OTHER): Payer: No Typology Code available for payment source

## 2021-08-05 ENCOUNTER — Other Ambulatory Visit: Payer: Self-pay

## 2021-08-05 DIAGNOSIS — Z1231 Encounter for screening mammogram for malignant neoplasm of breast: Secondary | ICD-10-CM

## 2021-08-07 ENCOUNTER — Other Ambulatory Visit: Payer: Self-pay | Admitting: Family Medicine

## 2021-08-07 DIAGNOSIS — R928 Other abnormal and inconclusive findings on diagnostic imaging of breast: Secondary | ICD-10-CM

## 2021-09-01 ENCOUNTER — Other Ambulatory Visit: Payer: Self-pay | Admitting: Family Medicine

## 2021-10-03 ENCOUNTER — Other Ambulatory Visit: Payer: Self-pay | Admitting: Family Medicine

## 2021-10-09 ENCOUNTER — Telehealth: Payer: Self-pay

## 2021-10-09 NOTE — Telephone Encounter (Signed)
Contact Sheila Gutierrez concerning diagnostic breast imaging. Pt states she did receive the notifications from Imaging but had not scheduled yet. She agrees to schedule.

## 2021-11-04 ENCOUNTER — Other Ambulatory Visit: Payer: Self-pay

## 2021-11-04 ENCOUNTER — Ambulatory Visit
Admission: RE | Admit: 2021-11-04 | Discharge: 2021-11-04 | Disposition: A | Discharge status: Home / Self Care | Payer: No Typology Code available for payment source | Source: Ambulatory Visit | Attending: Family Medicine | Admitting: Family Medicine

## 2021-11-04 ENCOUNTER — Other Ambulatory Visit: Payer: Self-pay | Admitting: Family Medicine

## 2021-11-04 DIAGNOSIS — R928 Other abnormal and inconclusive findings on diagnostic imaging of breast: Secondary | ICD-10-CM

## 2021-11-04 DIAGNOSIS — R921 Mammographic calcification found on diagnostic imaging of breast: Secondary | ICD-10-CM

## 2021-11-18 ENCOUNTER — Other Ambulatory Visit: Payer: Self-pay | Admitting: Diagnostic Radiology

## 2021-11-18 ENCOUNTER — Ambulatory Visit
Admission: RE | Admit: 2021-11-18 | Discharge: 2021-11-18 | Disposition: A | Discharge status: Home / Self Care | Payer: No Typology Code available for payment source | Source: Ambulatory Visit | Attending: Family Medicine | Admitting: Family Medicine

## 2021-11-18 DIAGNOSIS — R921 Mammographic calcification found on diagnostic imaging of breast: Secondary | ICD-10-CM

## 2022-01-24 ENCOUNTER — Other Ambulatory Visit: Payer: Self-pay | Admitting: Family Medicine

## 2022-01-26 NOTE — Telephone Encounter (Signed)
Pls contact the pt to schedule diabetes follow-up appt. Sending 60 day refill to hold until appt. Thanks

## 2022-01-26 NOTE — Telephone Encounter (Signed)
Patient has been scheduled for 03/10/22. AMUCk

## 2022-02-20 ENCOUNTER — Other Ambulatory Visit: Payer: Self-pay | Admitting: Family Medicine

## 2022-02-25 ENCOUNTER — Other Ambulatory Visit: Payer: Self-pay | Admitting: Family Medicine

## 2022-03-02 ENCOUNTER — Other Ambulatory Visit: Payer: Self-pay | Admitting: Family Medicine

## 2022-03-10 ENCOUNTER — Ambulatory Visit (INDEPENDENT_AMBULATORY_CARE_PROVIDER_SITE_OTHER): Payer: No Typology Code available for payment source | Admitting: Family Medicine

## 2022-03-10 ENCOUNTER — Encounter: Payer: Self-pay | Admitting: Family Medicine

## 2022-03-10 VITALS — BP 133/83 | HR 103 | Ht 71.0 in | Wt 302.0 lb

## 2022-03-10 DIAGNOSIS — M47816 Spondylosis without myelopathy or radiculopathy, lumbar region: Secondary | ICD-10-CM | POA: Diagnosis not present

## 2022-03-10 DIAGNOSIS — I10 Essential (primary) hypertension: Secondary | ICD-10-CM

## 2022-03-10 DIAGNOSIS — R109 Unspecified abdominal pain: Secondary | ICD-10-CM | POA: Diagnosis not present

## 2022-03-10 DIAGNOSIS — R8290 Unspecified abnormal findings in urine: Secondary | ICD-10-CM

## 2022-03-10 DIAGNOSIS — E119 Type 2 diabetes mellitus without complications: Secondary | ICD-10-CM | POA: Diagnosis not present

## 2022-03-10 LAB — POCT UA - MICROALBUMIN
Creatinine, POC: 300 mg/dL
Microalbumin Ur, POC: 80 mg/L

## 2022-03-10 LAB — POCT GLYCOSYLATED HEMOGLOBIN (HGB A1C): HbA1c, POC (controlled diabetic range): 5.8 % (ref 0.0–7.0)

## 2022-03-10 LAB — POCT URINALYSIS DIP (CLINITEK)
Bilirubin, UA: NEGATIVE
Glucose, UA: NEGATIVE mg/dL
Ketones, POC UA: NEGATIVE mg/dL
Nitrite, UA: NEGATIVE
POC PROTEIN,UA: 30 — AB
Spec Grav, UA: 1.025 (ref 1.010–1.025)
Urobilinogen, UA: 0.2 E.U./dL
pH, UA: 5.5 (ref 5.0–8.0)

## 2022-03-10 MED ORDER — LOSARTAN POTASSIUM 100 MG PO TABS: 100.0000 mg | ORAL_TABLET | Freq: Every day | ORAL | 3 refills | Status: DC

## 2022-03-10 MED ORDER — AMLODIPINE BESYLATE 10 MG PO TABS: 10.0000 mg | ORAL_TABLET | Freq: Every day | ORAL | 3 refills | Status: DC

## 2022-03-10 MED ORDER — CYCLOBENZAPRINE HCL 10 MG PO TABS: 10.0000 mg | ORAL_TABLET | Freq: Three times a day (TID) | ORAL | 0 refills | Status: DC | PRN

## 2022-03-10 MED ORDER — METFORMIN HCL ER 500 MG PO TB24: 1500.0000 mg | ORAL_TABLET | Freq: Every day | ORAL | 0 refills | Status: DC

## 2022-03-10 MED ORDER — IBUPROFEN 600 MG PO TABS: 600.0000 mg | ORAL_TABLET | Freq: Four times a day (QID) | ORAL | 0 refills | Status: DC | PRN

## 2022-03-10 MED ORDER — CHLORTHALIDONE 25 MG PO TABS: 25.0000 mg | ORAL_TABLET | Freq: Every day | ORAL | 3 refills | Status: DC

## 2022-03-10 NOTE — Progress Notes (Signed)
Sheila Gutierrez - 51 y.o. female MRN [id number]  Date of birth: 03-24-1971  Subjective Chief Complaint  Patient presents with   Diabetes    HPI Sheila Gutierrez is a 51 y.o. female here today for follow up visit.  She reports that she is doing well.    BP has remained well controlled with combination of amlodipine, chlorthalidone, and losartan.  Tolerating these well without side effects.  She has had had chest pain, shortness of breath, palpitations, headache or vision changes.    Continues on metformin for management of T2DM.  Tolerating well at this time.    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   Essential hypertension, benign 12/22/2012   Obesity 11/29/2012   Type 2 diabetes mellitus (HCC) 08/06/2014    Past Surgical History:  Procedure Laterality Date   REDUCTION MAMMAPLASTY Bilateral 2010    Social History   Socioeconomic History   Marital status: Single    Spouse name: Not on file   Number of children: Not on file   Years of education: Not on file   Highest education level: Not on file  Occupational History   Not on file  Tobacco Use   Smoking status: Never   Smokeless tobacco: Never  Substance and Sexual Activity   Alcohol use: Yes    Comment: once a month   Drug use: No   Sexual activity: Yes  Other Topics Concern   Not on file  Social History Narrative   Not on file   Social Determinants of Health   Financial Resource Strain: Not on file  Food Insecurity: Not on file  Transportation Needs: Not on file  Physical Activity: Not on file  Stress: Not on file  Social Connections: Not on file    Family History  Problem Relation Age of Onset   Pancreatic cancer Mother    Hypertension Mother        father   Hypertension Father     Breast cancer Cousin     Health Maintenance  Topic Date Due   Diabetic kidney evaluation - Urine ACR  Never done   Hepatitis C Screening  Never done   OPHTHALMOLOGY EXAM  10/08/2021   INFLUENZA VACCINE  03/30/2022   Diabetic kidney evaluation - GFR measurement  07/15/2022   FOOT EXAM  07/15/2022   HEMOGLOBIN A1C  09/10/2022   TETANUS/TDAP  12/23/2022   MAMMOGRAM  11/05/2023   COLONOSCOPY (Pts 45-10yrs Insurance coverage will need to be confirmed)  03/25/2024   PAP SMEAR-Modifier  07/15/2026   COVID-19 Vaccine  Completed   HIV Screening  Completed   Zoster Vaccines- Shingrix  Completed   HPV VACCINES  Aged Out     ----------------------------------------------------------------------------------------------------------------------------------------------------------------------------------------------------------------- Physical Exam BP 133/83 (BP Location: Right Arm, Patient Position: Sitting, Cuff Size: Large)   Pulse (!) 103   Ht 5\' 11"  (1.803 m)   Wt (!) 302 lb (137 kg)   SpO2 98%   BMI 42.12 kg/m   Physical Exam Constitutional:      Appearance: Normal appearance.  Eyes:     General: No scleral icterus. Cardiovascular:     Rate and Rhythm: Normal rate and regular rhythm.  Pulmonary:     Effort: Pulmonary effort is normal.  Breath sounds: Normal breath sounds.  Musculoskeletal:     Cervical back: Neck supple.  Neurological:     General: No focal deficit present.     Mental Status: She is alert.  Psychiatric:        Mood and Affect: Mood normal.        Behavior: Behavior normal.     ------------------------------------------------------------------------------------------------------------------------------------------------------------------------------------------------------------------- Assessment and Plan  Type 2 diabetes mellitus Diabetes remains well controlled.  Recommend continuation of metformin at current strength.    Essential  hypertension, benign BP is stable at this time.  Will plan to continue current medications for management of HTN.  Lumbar spondylosis Continued back pain.  Adding ibuprofen 600mg  q6 prn with flexeril 10mg  tid prn.  Encouraged to stay active and incorporate regular stretches.     Meds ordered this encounter  Medications   cyclobenzaprine (FLEXERIL) 10 MG tablet    Sig: Take 1 tablet (10 mg total) by mouth 3 (three) times daily as needed for muscle spasms.    Dispense:  30 tablet    Refill:  0   ibuprofen (ADVIL) 600 MG tablet    Sig: Take 1 tablet (600 mg total) by mouth every 6 (six) hours as needed for mild pain or moderate pain.    Dispense:  30 tablet    Refill:  0   chlorthalidone (HYGROTON) 25 MG tablet    Sig: Take 1 tablet (25 mg total) by mouth daily.    Dispense:  90 tablet    Refill:  3   amLODipine (NORVASC) 10 MG tablet    Sig: Take 1 tablet (10 mg total) by mouth daily.    Dispense:  90 tablet    Refill:  3   metFORMIN (GLUCOPHAGE-XR) 500 MG 24 hr tablet    Sig: Take 3 tablets (1,500 mg total) by mouth daily with breakfast.    Dispense:  90 tablet    Refill:  0   losartan (COZAAR) 100 MG tablet    Sig: Take 1 tablet (100 mg total) by mouth daily.    Dispense:  90 tablet    Refill:  3    Return in about 6 months (around 09/10/2022) for HTN/T2DM.    This visit occurred during the SARS-CoV-2 public health emergency.  Safety protocols were in place, including screening questions prior to the visit, additional usage of staff PPE, and extensive cleaning of exam room while observing appropriate contact time as indicated for disinfecting solutions.

## 2022-03-10 NOTE — Assessment & Plan Note (Signed)
Continued back pain.  Adding ibuprofen 600mg  q6 prn with flexeril 10mg  tid prn.  Encouraged to stay active and incorporate regular stretches.

## 2022-03-10 NOTE — Assessment & Plan Note (Signed)
Diabetes remains well controlled.  Recommend continuation of metformin at current strength.  

## 2022-03-10 NOTE — Assessment & Plan Note (Signed)
BP is stable at this time.  Will plan to continue current medications for management of HTN.

## 2022-03-10 NOTE — Patient Instructions (Addendum)
Try the 600mg  Ibuprofen and cyclobenzaprine as needed.  The cyclobenzaprine may make you drowsy.   You may also take acetaminophen( tylenol) 1000mg  every 8 hours as needed.   Continue all other rmedications for now.

## 2022-03-11 ENCOUNTER — Encounter: Payer: Self-pay | Admitting: Family Medicine

## 2022-03-12 LAB — URINE CULTURE
MICRO NUMBER:: 13640335
Result:: NO GROWTH
SPECIMEN QUALITY:: ADEQUATE

## 2022-03-19 ENCOUNTER — Other Ambulatory Visit: Payer: Self-pay | Admitting: Family Medicine

## 2022-05-28 ENCOUNTER — Other Ambulatory Visit: Payer: Self-pay | Admitting: Family Medicine

## 2022-08-19 ENCOUNTER — Other Ambulatory Visit: Payer: Self-pay | Admitting: Family Medicine

## 2022-08-19 DIAGNOSIS — M47816 Spondylosis without myelopathy or radiculopathy, lumbar region: Secondary | ICD-10-CM

## 2022-08-21 ENCOUNTER — Other Ambulatory Visit: Payer: Self-pay | Admitting: Family Medicine

## 2022-09-17 ENCOUNTER — Other Ambulatory Visit: Payer: Self-pay | Admitting: Family Medicine

## 2022-09-17 NOTE — Telephone Encounter (Signed)
Scheduled 10/06/2022 @1 :50 tvt

## 2022-09-17 NOTE — Telephone Encounter (Signed)
Please contact patient to schedule appt for DM. Sending 30 day medication refill. Thanks

## 2022-10-06 ENCOUNTER — Ambulatory Visit: Payer: No Typology Code available for payment source | Admitting: Family Medicine

## 2022-10-11 ENCOUNTER — Other Ambulatory Visit: Payer: Self-pay | Admitting: Family Medicine

## 2022-10-11 DIAGNOSIS — M47816 Spondylosis without myelopathy or radiculopathy, lumbar region: Secondary | ICD-10-CM

## 2022-10-14 ENCOUNTER — Encounter: Payer: Self-pay | Admitting: Family Medicine

## 2022-10-14 ENCOUNTER — Ambulatory Visit (INDEPENDENT_AMBULATORY_CARE_PROVIDER_SITE_OTHER): Payer: No Typology Code available for payment source | Admitting: Family Medicine

## 2022-10-14 VITALS — BP 138/81 | HR 102 | Ht 71.0 in | Wt 296.1 lb

## 2022-10-14 DIAGNOSIS — I10 Essential (primary) hypertension: Secondary | ICD-10-CM | POA: Diagnosis not present

## 2022-10-14 DIAGNOSIS — M545 Low back pain, unspecified: Secondary | ICD-10-CM

## 2022-10-14 DIAGNOSIS — E119 Type 2 diabetes mellitus without complications: Secondary | ICD-10-CM

## 2022-10-14 DIAGNOSIS — Z13220 Encounter for screening for lipoid disorders: Secondary | ICD-10-CM

## 2022-10-14 DIAGNOSIS — G8929 Other chronic pain: Secondary | ICD-10-CM

## 2022-10-14 LAB — POCT GLYCOSYLATED HEMOGLOBIN (HGB A1C): HbA1c, POC (prediabetic range): 6.9 % — AB (ref 5.7–6.4)

## 2022-10-14 MED ORDER — TIRZEPATIDE 2.5 MG/0.5ML ~~LOC~~ SOAJ: 2.5000 mg | SUBCUTANEOUS | 0 refills | Status: DC

## 2022-10-14 MED ORDER — TIRZEPATIDE 7.5 MG/0.5ML ~~LOC~~ SOAJ: 7.5000 mg | SUBCUTANEOUS | 0 refills | Status: DC

## 2022-10-14 MED ORDER — PREDNISONE 50 MG PO TABS: ORAL_TABLET | ORAL | 0 refills | Status: DC

## 2022-10-14 MED ORDER — METHOCARBAMOL 750 MG PO TABS: 750.0000 mg | ORAL_TABLET | Freq: Three times a day (TID) | ORAL | 1 refills | Status: DC | PRN

## 2022-10-14 MED ORDER — MELOXICAM 15 MG PO TABS: 15.0000 mg | ORAL_TABLET | Freq: Every day | ORAL | 1 refills | Status: DC | PRN

## 2022-10-14 MED ORDER — TIRZEPATIDE 5 MG/0.5ML ~~LOC~~ SOAJ: 5.0000 mg | SUBCUTANEOUS | 0 refills | Status: DC

## 2022-10-14 NOTE — Patient Instructions (Addendum)
Take prednisone x5 days.  Lets try meloxicam and methocarbamol for back pain.  MRI has been ordered.  You will be contacted to schedule.   Let's try Mounjaro.  Increase by 2.12m each month  See me again in 4 months.

## 2022-10-14 NOTE — Assessment & Plan Note (Addendum)
Diabetes is slightly worsened.  We discussed working on dietary changes to help with blood sugars.  Her exercise is limited due to her back pain.  Adding Mounjaro as I think this will be helpful for her weight management and blood sugar control.  Side effects reviewed.

## 2022-10-14 NOTE — Assessment & Plan Note (Signed)
Blood pressure remains fairly well-controlled.  Recommend continuation of current medications for management of hypertension.

## 2022-10-14 NOTE — Progress Notes (Signed)
Sheila Gutierrez - 52 y.o. female MRN [id number]  Date of birth: [birth year]  Subjective Chief Complaint  Patient presents with   Medication Refill    HPI Sheila Gutierrez is a 52 year old female here today for follow-up visit.  Continues on metformin for management of her diabetes.  A1c has increased some today at 6.9%.  Her weight is down a few pounds since last visit.  Admits that diet could be better.  She is moderately active.  She denies any symptoms related to her diabetes.  She would be willing to try another medication for her blood sugars.  She continues to have a significant amount of back pain.  Pain is mostly in the lower back.  She has had previous x-rays showing degenerative changes.  She did try physical therapy for several weeks and has continued home exercises however continues to have pain.  She has not had radiation of pain, numbness, tingling or weakness into the lower extremities.  Ibuprofen and Flexeril did not seem very effective at this point.  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   Essential hypertension, benign 12/22/2012   Obesity 11/29/2012   Type 2 diabetes mellitus ([place]) 08/06/2014    Past Surgical History:  Procedure Laterality Date   REDUCTION MAMMAPLASTY Bilateral 2010    Social History   Socioeconomic History   Marital status: Single    Spouse name: Not on file   Number of children: Not on file   Years of education: Not on file   Highest education level: Not on file  Occupational History   Not on file  Tobacco Use   Smoking status: Never   Smokeless tobacco: Never  Substance and Sexual Activity   Alcohol use: Yes    Comment: once a month    Drug use: No   Sexual activity: Yes  Other Topics Concern   Not on file  Social History Narrative   Not on file   Social Determinants of Health   Financial Resource Strain: Not on file  Food Insecurity: Not on file  Transportation Needs: Not on file  Physical Activity: Not on file  Stress: Not on file  Social Connections: Not on file    Family History  Problem Relation Age of Onset   Pancreatic cancer Mother    Hypertension Mother        father   Hypertension Father    Breast cancer Cousin     Health Maintenance  Topic Date Due   Diabetic kidney evaluation - eGFR measurement  07/15/2022   OPHTHALMOLOGY EXAM  10/22/2022 (Originally 10/08/2021)   DTaP/Tdap/Td (2 - Td or Tdap) 12/23/2022   Diabetic kidney evaluation - Urine ACR  03/11/2023   HEMOGLOBIN A1C  04/14/2023   FOOT EXAM  10/15/2023   MAMMOGRAM  11/05/2023   COLONOSCOPY (Pts 45-43yr Insurance coverage will need to be confirmed)  03/25/2024   PAP SMEAR-Modifier  07/15/2026   INFLUENZA VACCINE  Completed   COVID-19 Vaccine  Completed   HIV Screening  Completed   Zoster Vaccines- Shingrix  Completed   HPV VACCINES  Aged Out   Hepatitis C Screening  Discontinued     ----------------------------------------------------------------------------------------------------------------------------------------------------------------------------------------------------------------- Physical Exam  BP 138/81   Pulse (!) 102   Ht 5' 11"$  (1.803 m)   Wt 296 lb 1.9 oz (134.3 kg)   SpO2 97%   BMI 41.30 kg/m   Physical Exam Constitutional:      Appearance: Normal appearance.  HENT:     Head: Normocephalic and atraumatic.  Eyes:     General: No scleral icterus. Cardiovascular:     Rate and Rhythm: Normal rate and regular rhythm.  Pulmonary:     Effort: Pulmonary effort is normal.     Breath sounds: Normal breath sounds.  Musculoskeletal:     Cervical back: Neck supple.  Neurological:     Mental Status: She is  alert.  Psychiatric:        Mood and Affect: Mood normal.        Behavior: Behavior normal.     ------------------------------------------------------------------------------------------------------------------------------------------------------------------------------------------------------------------- Assessment and Plan  Essential hypertension, benign Blood pressure remains fairly well-controlled.  Recommend continuation of current medications for management of hypertension.  Type 2 diabetes mellitus Diabetes is slightly worsened.  We discussed working on dietary changes to help with blood sugars.  Her exercise is limited due to her back pain.  Adding Mounjaro as I think this will be helpful for her weight management and blood sugar control.  Side effects reviewed.  Chronic left-sided low back pain without sciatica She continues to have low back pain.  Previous x-rays with degenerative changes and degenerative disc disease.  Adding prednisone burst.  Change ibuprofen to meloxicam and wall see if Robaxin is more effective for her.  MRI lumbar spine ordered.   Meds ordered this encounter  Medications   methocarbamol (ROBAXIN-750) 750 MG tablet    Sig: Take 1 tablet (750 mg total) by mouth every 8 (eight) hours as needed for muscle spasms.    Dispense:  90 tablet    Refill:  1   meloxicam (MOBIC) 15 MG tablet    Sig: Take 1 tablet (15 mg total) by mouth daily as needed for pain.    Dispense:  30 tablet    Refill:  1   predniSONE (DELTASONE) 50 MG tablet    Sig: Take 29m daily x5 days.    Dispense:  5 tablet    Refill:  0   tirzepatide (MOUNJARO) 2.5 MG/0.5ML Pen    Sig: Inject 2.5 mg into the skin once a week. Increase to 571mafter 4 weeks.    Dispense:  2 mL    Refill:  0   tirzepatide (MOUNJARO) 5 MG/0.5ML Pen    Sig: Inject 5 mg into the skin once a week. Increase to 7.35m40mfter 4 weeks.    Dispense:  2 mL    Refill:  0   tirzepatide (MOUNJARO) 7.5 MG/0.5ML Pen     Sig: Inject 7.5 mg into the skin once a week.    Dispense:  6 mL    Refill:  0    Return in about 4 months (around 02/12/2023) for F/u Mounjaro.    This visit occurred during the SARS-CoV-2 public health emergency.  Safety protocols were in place, including screening questions prior to the visit, additional usage of staff PPE, and extensive cleaning of exam room while observing appropriate contact time as indicated for disinfecting solutions.

## 2022-10-14 NOTE — Assessment & Plan Note (Signed)
She continues to have low back pain.  Previous x-rays with degenerative changes and degenerative disc disease.  Adding prednisone burst.  Change ibuprofen to meloxicam and wall see if Robaxin is more effective for her.  MRI lumbar spine ordered.

## 2022-10-20 ENCOUNTER — Ambulatory Visit: Payer: No Typology Code available for payment source | Admitting: Family Medicine

## 2022-10-20 LAB — HM DIABETES EYE EXAM

## 2022-10-24 ENCOUNTER — Ambulatory Visit (INDEPENDENT_AMBULATORY_CARE_PROVIDER_SITE_OTHER): Payer: No Typology Code available for payment source

## 2022-10-24 DIAGNOSIS — M545 Low back pain, unspecified: Secondary | ICD-10-CM

## 2022-10-24 DIAGNOSIS — G8929 Other chronic pain: Secondary | ICD-10-CM | POA: Diagnosis not present

## 2022-10-28 ENCOUNTER — Other Ambulatory Visit: Payer: Self-pay | Admitting: Family Medicine

## 2022-10-28 DIAGNOSIS — M47816 Spondylosis without myelopathy or radiculopathy, lumbar region: Secondary | ICD-10-CM

## 2022-10-28 DIAGNOSIS — M5416 Radiculopathy, lumbar region: Secondary | ICD-10-CM

## 2022-11-09 ENCOUNTER — Encounter: Payer: Self-pay | Admitting: Family Medicine

## 2022-11-16 ENCOUNTER — Other Ambulatory Visit: Payer: Self-pay | Admitting: Neurosurgery

## 2022-11-16 ENCOUNTER — Other Ambulatory Visit: Payer: Self-pay | Admitting: Family Medicine

## 2022-11-16 ENCOUNTER — Ambulatory Visit: Payer: No Typology Code available for payment source

## 2022-11-16 DIAGNOSIS — M545 Low back pain, unspecified: Secondary | ICD-10-CM

## 2022-11-16 DIAGNOSIS — M4726 Other spondylosis with radiculopathy, lumbar region: Secondary | ICD-10-CM

## 2022-11-28 ENCOUNTER — Other Ambulatory Visit: Payer: Self-pay | Admitting: Family Medicine

## 2022-12-15 ENCOUNTER — Other Ambulatory Visit: Payer: Self-pay | Admitting: Family Medicine

## 2022-12-15 DIAGNOSIS — M47816 Spondylosis without myelopathy or radiculopathy, lumbar region: Secondary | ICD-10-CM

## 2022-12-15 DIAGNOSIS — M5416 Radiculopathy, lumbar region: Secondary | ICD-10-CM

## 2022-12-17 ENCOUNTER — Other Ambulatory Visit: Payer: Self-pay | Admitting: Family Medicine

## 2023-01-22 ENCOUNTER — Other Ambulatory Visit: Payer: Self-pay | Admitting: Family Medicine

## 2023-02-07 ENCOUNTER — Other Ambulatory Visit: Payer: Self-pay | Admitting: Family Medicine

## 2023-02-09 ENCOUNTER — Encounter: Payer: Self-pay | Admitting: Family Medicine

## 2023-02-09 ENCOUNTER — Ambulatory Visit: Payer: No Typology Code available for payment source | Admitting: Family Medicine

## 2023-02-09 VITALS — BP 107/66 | HR 91 | Ht 71.0 in | Wt 268.0 lb

## 2023-02-09 DIAGNOSIS — M47816 Spondylosis without myelopathy or radiculopathy, lumbar region: Secondary | ICD-10-CM | POA: Diagnosis not present

## 2023-02-09 DIAGNOSIS — Z23 Encounter for immunization: Secondary | ICD-10-CM | POA: Diagnosis not present

## 2023-02-09 DIAGNOSIS — I10 Essential (primary) hypertension: Secondary | ICD-10-CM | POA: Diagnosis not present

## 2023-02-09 DIAGNOSIS — M5416 Radiculopathy, lumbar region: Secondary | ICD-10-CM | POA: Insufficient documentation

## 2023-02-09 DIAGNOSIS — Z7984 Long term (current) use of oral hypoglycemic drugs: Secondary | ICD-10-CM

## 2023-02-09 DIAGNOSIS — M48061 Spinal stenosis, lumbar region without neurogenic claudication: Secondary | ICD-10-CM | POA: Insufficient documentation

## 2023-02-09 DIAGNOSIS — E119 Type 2 diabetes mellitus without complications: Secondary | ICD-10-CM

## 2023-02-09 LAB — POCT GLYCOSYLATED HEMOGLOBIN (HGB A1C): HbA1c, POC (controlled diabetic range): 5.2 % (ref 0.0–7.0)

## 2023-02-09 MED ORDER — CYCLOBENZAPRINE HCL 10 MG PO TABS: 10.0000 mg | ORAL_TABLET | Freq: Three times a day (TID) | ORAL | 3 refills | Status: DC | PRN

## 2023-02-09 MED ORDER — TRAMADOL HCL 50 MG PO TABS: 50.0000 mg | ORAL_TABLET | Freq: Three times a day (TID) | ORAL | 0 refills | Status: AC | PRN

## 2023-02-09 MED ORDER — CHLORTHALIDONE 25 MG PO TABS
25.0000 mg | ORAL_TABLET | Freq: Every day | ORAL | 1 refills | Status: DC
Start: 2023-02-09 — End: 2023-08-05

## 2023-02-09 MED ORDER — TIRZEPATIDE 10 MG/0.5ML ~~LOC~~ SOAJ
10.0000 mg | SUBCUTANEOUS | 0 refills | Status: DC
Start: 2023-02-09 — End: 2023-05-31

## 2023-02-09 MED ORDER — AMLODIPINE BESYLATE 10 MG PO TABS
10.0000 mg | ORAL_TABLET | Freq: Every day | ORAL | 1 refills | Status: DC
Start: 2023-02-09 — End: 2023-08-05

## 2023-02-09 MED ORDER — LOSARTAN POTASSIUM 100 MG PO TABS: 100.0000 mg | ORAL_TABLET | Freq: Every day | ORAL | 1 refills | Status: DC

## 2023-02-09 NOTE — Assessment & Plan Note (Signed)
Continues to have chronic pain related to this.  Has tried multiple treatments without significant improvement.  Adding tramadol and changing muscle relaxer to flexeril as this has been more effective for her in the past.

## 2023-02-09 NOTE — Assessment & Plan Note (Signed)
Diabetes is very well controlled with A1c of 5.2% today.  She will continue tirzepatide.  Titrate to 10mg  as this has been more readily avaialable and in stock.  Encouraged working on dietary habits. F/u in 4 months.

## 2023-02-09 NOTE — Assessment & Plan Note (Signed)
BP is well controlled.  Discussed monitoring for hypotension as she loses weight.

## 2023-02-09 NOTE — Progress Notes (Signed)
SYNDEY Gutierrez - 52 y.o. female MRN [id number]  Date of birth: 1971/02/26  Subjective Chief Complaint  Patient presents with   Diabetes    HPI Sheila Gutierrez is  a 52 y.o. female here today for follow up.   She is using mounjaro, currently on 7.5mg /week.  Ready to titrate to 10mg .  She is doing well with this.  Weight down nearly 30lbs since last visit.  A1c today is 5.2%.   BP managed with losartan, chlorthalidone and amlodipine.  BP is well controlled.  She denies symptoms of hypotension.  Denies chest pain, shortness of breath, palpitations, headache or vision changes.   Continues to deal with chronic back pain.  She has tried injections, breast reduction, oral nsaids and muscle relaxers.  Does have radicular symptoms at times.  She is requesting handicap placard form to be completed.   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   Essential hypertension, benign 12/22/2012   Obesity 11/29/2012   Type 2 diabetes mellitus (HCC) 08/06/2014    Past Surgical History:  Procedure Laterality Date   REDUCTION MAMMAPLASTY Bilateral 2010    Social History   Socioeconomic History   Marital status: Single    Spouse name: Not on file   Number of children: Not on file   Years of education: Not on file   Highest education level: 12th grade  Occupational History   Not on file  Tobacco Use   Smoking status: Never   Smokeless tobacco: Never  Substance and Sexual Activity   Alcohol use: Yes    Comment: once a month   Drug use: No   Sexual activity: Yes  Other Topics Concern   Not on file  Social History Narrative   Not on file   Social Determinants of Health   Financial Resource  Strain: Low Risk  (02/07/2023)   Overall Financial Resource Strain (CARDIA)    Difficulty of Paying Living Expenses: Not very hard  Food Insecurity: No Food Insecurity (02/07/2023)   Hunger Vital Sign    Worried About Running Out of Food in the Last Year: Never true    Ran Out of Food in the Last Year: Never true  Transportation Needs: No Transportation Needs (02/07/2023)   PRAPARE - Administrator, Civil Service (Medical): No    Lack of Transportation (Non-Medical): No  Physical Activity: Sufficiently Active (02/07/2023)   Exercise Vital Sign    Days of Exercise per Week: 5 days    Minutes of Exercise per Session: 30 min  Stress: No Stress Concern Present (02/07/2023)   [employer] of Occupational Health - Occupational Stress Questionnaire    Feeling of Stress : Not at all  Social Connections: Moderately Integrated (02/07/2023)   Social Connection and Isolation Panel [NHANES]    Frequency of Communication with Friends and Family: More than three times a week    Frequency of Social Gatherings with Friends and Family: More than three times a week    Attends Religious Services: More than 4 times per year    Active Member of [employer] or Organizations: Yes    Attends Banker Meetings: More than 4 times per year  Marital Status: Never married    Family History  Problem Relation Age of Onset   Pancreatic cancer Mother    Hypertension Mother        father   Hypertension Father    Breast cancer Cousin     Health Maintenance  Topic Date Due   Diabetic kidney evaluation - eGFR measurement  07/15/2022   DTaP/Tdap/Td (2 - Td or Tdap) 12/23/2022   Diabetic kidney evaluation - Urine ACR  03/11/2023   INFLUENZA VACCINE  03/31/2023   HEMOGLOBIN A1C  08/11/2023   FOOT EXAM  10/15/2023   OPHTHALMOLOGY EXAM  10/21/2023   MAMMOGRAM  11/05/2023   Colonoscopy  03/25/2024   PAP SMEAR-Modifier  07/15/2026   COVID-19 Vaccine  Completed   HIV Screening  Completed    Zoster Vaccines- Shingrix  Completed   HPV VACCINES  Aged Out   Hepatitis C Screening  Discontinued     ----------------------------------------------------------------------------------------------------------------------------------------------------------------------------------------------------------------- Physical Exam BP 107/66 (BP Location: Right Arm, Patient Position: Sitting, Cuff Size: Large)   Pulse 91   Ht 5\' 11"  (1.803 m)   Wt 268 lb (121.6 kg)   SpO2 95%   BMI 37.38 kg/m   Physical Exam Constitutional:      Appearance: Normal appearance.  HENT:     Head: Normocephalic and atraumatic.  Eyes:     General: No scleral icterus. Cardiovascular:     Rate and Rhythm: Normal rate and regular rhythm.  Pulmonary:     Effort: Pulmonary effort is normal.     Breath sounds: Normal breath sounds.  Neurological:     Mental Status: She is alert.  Psychiatric:        Mood and Affect: Mood normal.        Behavior: Behavior normal.     ------------------------------------------------------------------------------------------------------------------------------------------------------------------------------------------------------------------- Assessment and Plan  Type 2 diabetes mellitus Diabetes is very well controlled with A1c of 5.2% today.  She will continue tirzepatide.  Titrate to 10mg  as this has been more readily avaialable and in stock.  Encouraged working on dietary habits. F/u in 4 months.   Essential hypertension, benign BP is well controlled.  Discussed monitoring for hypotension as she loses weight.    Lumbar spondylosis Continues to have chronic pain related to this.  Has tried multiple treatments without significant improvement.  Adding tramadol and changing muscle relaxer to flexeril as this has been more effective for her in the past.    Meds ordered this encounter  Medications   amLODipine (NORVASC) 10 MG tablet    Sig: Take 1 tablet (10 mg total)  by mouth daily.    Dispense:  90 tablet    Refill:  1   chlorthalidone (HYGROTON) 25 MG tablet    Sig: Take 1 tablet (25 mg total) by mouth daily.    Dispense:  90 tablet    Refill:  1   losartan (COZAAR) 100 MG tablet    Sig: Take 1 tablet (100 mg total) by mouth daily.    Dispense:  90 tablet    Refill:  1   tirzepatide (MOUNJARO) 10 MG/0.5ML Pen    Sig: Inject 10 mg into the skin once a week.    Dispense:  6 mL    Refill:  0   traMADol (ULTRAM) 50 MG tablet    Sig: Take 1 tablet (50 mg total) by mouth every 8 (eight) hours as needed for up to 5 days.    Dispense:  15 tablet    Refill:  0  cyclobenzaprine (FLEXERIL) 10 MG tablet    Sig: Take 1 tablet (10 mg total) by mouth 3 (three) times daily as needed for muscle spasms.    Dispense:  30 tablet    Refill:  3    Return in about 4 months (around 06/11/2023) for T2DM/HTN.    This visit occurred during the SARS-CoV-2 public health emergency.  Safety protocols were in place, including screening questions prior to the visit, additional usage of staff PPE, and extensive cleaning of exam room while observing appropriate contact time as indicated for disinfecting solutions.

## 2023-02-10 ENCOUNTER — Ambulatory Visit: Payer: No Typology Code available for payment source | Admitting: Family Medicine

## 2023-02-11 ENCOUNTER — Other Ambulatory Visit: Payer: Self-pay | Admitting: Family Medicine

## 2023-02-11 DIAGNOSIS — M47816 Spondylosis without myelopathy or radiculopathy, lumbar region: Secondary | ICD-10-CM

## 2023-02-11 DIAGNOSIS — M5416 Radiculopathy, lumbar region: Secondary | ICD-10-CM

## 2023-03-04 ENCOUNTER — Telehealth (INDEPENDENT_AMBULATORY_CARE_PROVIDER_SITE_OTHER): Payer: No Typology Code available for payment source | Admitting: Medical-Surgical

## 2023-03-04 ENCOUNTER — Encounter: Payer: Self-pay | Admitting: Medical-Surgical

## 2023-03-04 VITALS — Temp 101.0°F | Ht 71.0 in | Wt 268.0 lb

## 2023-03-04 DIAGNOSIS — U071 COVID-19: Secondary | ICD-10-CM

## 2023-03-04 MED ORDER — NIRMATRELVIR/RITONAVIR (PAXLOVID)TABLET: 3.0000 | ORAL_TABLET | Freq: Two times a day (BID) | ORAL | 0 refills | Status: DC

## 2023-03-04 MED ORDER — HYDROCOD POLI-CHLORPHE POLI ER 10-8 MG/5ML PO SUER: 5.0000 mL | Freq: Two times a day (BID) | ORAL | 0 refills | Status: DC | PRN

## 2023-03-04 MED ORDER — DEXAMETHASONE 4 MG PO TABS: 4.0000 mg | ORAL_TABLET | Freq: Two times a day (BID) | ORAL | 0 refills | Status: DC

## 2023-03-04 NOTE — Progress Notes (Signed)
Virtual Visit via Video Note  I connected with Sheila Gutierrez on 03/04/23 at  1:00 PM EDT by a video enabled telemedicine application and verified that I am speaking with the correct person using two identifiers.   I discussed the limitations of evaluation and management by telemedicine and the availability of in person appointments. The patient expressed understanding and agreed to proceed.  Patient location: home Provider locations: office  Subjective:    CC: COVID +  HPI: Pleasant 52 year old female presenting today with reports of upper respiratory symptoms that started abruptly yesterday on awakening.  She is experiencing fever Tmax 101, dry cough, rhinorrhea, sinus congestion, sore throat, chills, body aches, fatigue, and mild diarrhea.  She has been increasing p.o. fluid intake and has tried taking over-the-counter cold medications without relief of symptoms.  Tested yesterday and today for COVID with positive results x 2.  Past medical history, Surgical history, Family history not pertinant except as noted below, Social history, Allergies, and medications have been entered into the medical record, reviewed, and corrections made.   Review of Systems: See HPI for pertinent positives and negatives.   Objective:    General: Speaking clearly in complete sentences without any shortness of breath.  Alert and oriented x3.  Normal judgment. No apparent acute distress.  Impression and Recommendations:    1. COVID-19 virus infection Patient admittedly feeling horrible with her current symptoms but no red flags or acute distress.  Treating with Paxlovid twice daily x 5 days.  Adding Decadron 4 mg twice daily x 5 days.  Sending Tussionex to help with cough twice daily as needed.  Okay to use over-the-counter cold and flu preparations however advised aiming for those that are made for patients with hypertension.  Push fluids and eat small frequent meals.  Monitor for worsening of  symptoms.  I discussed the assessment and treatment plan with the patient. The patient was provided an opportunity to ask questions and all were answered. The patient agreed with the plan and demonstrated an understanding of the instructions.   The patient was advised to call back or seek an in-person evaluation if the symptoms worsen or if the condition fails to improve as anticipated.  20 minutes of non-face-to-face time was provided during this encounter.  Return if symptoms worsen or fail to improve.  Thayer Ohm, DNP, APRN, FNP-BC [facility]

## 2023-03-08 ENCOUNTER — Telehealth: Payer: Self-pay

## 2023-03-08 NOTE — Telephone Encounter (Signed)
Sheila Gutierrez called and left a message stating she is still sick with Covid symptoms. She needs to be reevaluated. Please call and schedule patient for this week. Thanks

## 2023-03-09 ENCOUNTER — Encounter: Payer: Self-pay | Admitting: Family Medicine

## 2023-03-09 ENCOUNTER — Ambulatory Visit: Payer: No Typology Code available for payment source | Admitting: Family Medicine

## 2023-03-09 VITALS — BP 116/71 | HR 91 | Ht 71.0 in | Wt 260.0 lb

## 2023-03-09 DIAGNOSIS — U071 COVID-19: Secondary | ICD-10-CM

## 2023-03-09 MED ORDER — HYDROCOD POLI-CHLORPHE POLI ER 10-8 MG/5ML PO SUER: 5.0000 mL | Freq: Two times a day (BID) | ORAL | 0 refills | Status: DC | PRN

## 2023-03-09 NOTE — Assessment & Plan Note (Signed)
Still some symptoms.  Recommend adding Mucinex with increase fluid intake.  Tussionex renewed. Red flag symptoms discussed.

## 2023-03-09 NOTE — Progress Notes (Signed)
SHAKIRRA BUEHLER - 52 y.o. female MRN [id number]  Date of birth: 19-Jul-1971  Subjective Chief Complaint  Patient presents with   Covid Positive    HPI KATURA EATHERLY is a 52 year old female here today for follow-up of COVID.  Diagnosed with COVID last week.  Treated with dexamethasone and was prescribed Tussionex as needed.  She was also prescribed Paxlovid however she reports that this was $400 so she did not take this.  She has had continued diminished taste with cough and congestion.  She denies wheezing, shortness of breath, chest pain or palpitations.  She would like a refill on Tussionex.  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   Essential hypertension, benign 12/22/2012   Obesity 11/29/2012   Type 2 diabetes mellitus (HCC) 08/06/2014    Past Surgical History:  Procedure Laterality Date   REDUCTION MAMMAPLASTY Bilateral 2010    Social History   Socioeconomic History   Marital status: Single    Spouse name: Not on file   Number of children: Not on file   Years of education: Not on file   Highest education level: 12th grade  Occupational History   Not on file  Tobacco Use   Smoking status: Never   Smokeless tobacco: Never  Substance and Sexual Activity   Alcohol use: Yes    Comment: once a month   Drug use: No   Sexual activity: Yes  Other Topics Concern   Not on file  Social History Narrative   Not on file   Social Determinants of Health   Financial Resource Strain: Low Risk  (02/07/2023)   Overall Financial Resource Strain (CARDIA)    Difficulty of Paying Living Expenses: Not very hard  Food Insecurity: No Food Insecurity (02/07/2023)   Hunger Vital Sign     Worried About Running Out of Food in the Last Year: Never true    Ran Out of Food in the Last Year: Never true  Transportation Needs: No Transportation Needs (02/07/2023)   PRAPARE - Administrator, Civil Service (Medical): No    Lack of Transportation (Non-Medical): No  Physical Activity: Sufficiently Active (02/07/2023)   Exercise Vital Sign    Days of Exercise per Week: 5 days    Minutes of Exercise per Session: 30 min  Stress: No Stress Concern Present (02/07/2023)   [employer] of Occupational Health - Occupational Stress Questionnaire    Feeling of Stress : Not at all  Social Connections: Moderately Integrated (02/07/2023)   Social Connection and Isolation Panel [NHANES]    Frequency of Communication with Friends and Family: More than three times a week    Frequency of Social Gatherings with Friends and Family: More than three times a week    Attends Religious Services: More than 4 times per year    Active Member of [employer] or Organizations: Yes    Attends Engineer, structural: More than 4 times per year    Marital Status: Never married    Family History  Problem Relation Age of Onset   Pancreatic cancer Mother    Hypertension Mother        father   Hypertension Father  Breast cancer Cousin     Health Maintenance  Topic Date Due   Diabetic kidney evaluation - eGFR measurement  07/15/2022   Diabetic kidney evaluation - Urine ACR  03/11/2023   INFLUENZA VACCINE  03/31/2023   HEMOGLOBIN A1C  08/11/2023   FOOT EXAM  10/15/2023   OPHTHALMOLOGY EXAM  10/21/2023   MAMMOGRAM  11/05/2023   Colonoscopy  03/25/2024   PAP SMEAR-Modifier  07/15/2026   DTaP/Tdap/Td (3 - Td or Tdap) 02/08/2033   COVID-19 Vaccine  Completed   HIV Screening  Completed   Zoster Vaccines- Shingrix  Completed   HPV VACCINES  Aged Out   Hepatitis C Screening  Discontinued      ----------------------------------------------------------------------------------------------------------------------------------------------------------------------------------------------------------------- Physical Exam BP 116/71 (BP Location: Left Arm, Patient Position: Sitting, Cuff Size: Large)   Pulse 91   Ht 5\' 11"  (1.803 m)   Wt 260 lb (117.9 kg)   SpO2 99%   BMI 36.26 kg/m   Physical Exam Constitutional:      Appearance: Normal appearance.  HENT:     Head: Normocephalic and atraumatic.     Right Ear: Tympanic membrane normal.     Left Ear: Tympanic membrane normal.  Eyes:     General: No scleral icterus. Cardiovascular:     Rate and Rhythm: Normal rate and regular rhythm.  Pulmonary:     Effort: Pulmonary effort is normal.     Breath sounds: Normal breath sounds.  Neurological:     Mental Status: She is alert.  Psychiatric:        Mood and Affect: Mood normal.        Behavior: Behavior normal.     ------------------------------------------------------------------------------------------------------------------------------------------------------------------------------------------------------------------- Assessment and Plan  COVID-19 Still some symptoms.  Recommend adding Mucinex with increase fluid intake.  Tussionex renewed. Red flag symptoms discussed.   Meds ordered this encounter  Medications   chlorpheniramine-HYDROcodone (TUSSIONEX) 10-8 MG/5ML    Sig: Take 5 mLs by mouth every 12 (twelve) hours as needed for cough (cough, will cause drowsiness.).    Dispense:  120 mL    Refill:  0    No follow-ups on file.    This visit occurred during the SARS-CoV-2 public health emergency.  Safety protocols were in place, including screening questions prior to the visit, additional usage of staff PPE, and extensive cleaning of exam room while observing appropriate contact time as indicated for disinfecting solutions.

## 2023-03-14 ENCOUNTER — Other Ambulatory Visit: Payer: Self-pay | Admitting: Family Medicine

## 2023-04-12 ENCOUNTER — Ambulatory Visit: Payer: No Typology Code available for payment source

## 2023-04-12 ENCOUNTER — Other Ambulatory Visit: Payer: Self-pay | Admitting: Neurosurgery

## 2023-04-12 DIAGNOSIS — M48061 Spinal stenosis, lumbar region without neurogenic claudication: Secondary | ICD-10-CM

## 2023-04-13 ENCOUNTER — Other Ambulatory Visit: Payer: Self-pay | Admitting: Family Medicine

## 2023-04-30 ENCOUNTER — Other Ambulatory Visit: Payer: Self-pay | Admitting: Family Medicine

## 2023-05-29 ENCOUNTER — Other Ambulatory Visit: Payer: Self-pay | Admitting: Family Medicine

## 2023-05-29 DIAGNOSIS — E119 Type 2 diabetes mellitus without complications: Secondary | ICD-10-CM

## 2023-06-01 ENCOUNTER — Encounter: Payer: No Typology Code available for payment source | Admitting: Family Medicine

## 2023-06-03 ENCOUNTER — Other Ambulatory Visit: Payer: Self-pay | Admitting: Family Medicine

## 2023-06-03 DIAGNOSIS — Z1231 Encounter for screening mammogram for malignant neoplasm of breast: Secondary | ICD-10-CM

## 2023-06-08 DIAGNOSIS — Z1231 Encounter for screening mammogram for malignant neoplasm of breast: Secondary | ICD-10-CM | POA: Diagnosis not present

## 2023-06-09 ENCOUNTER — Other Ambulatory Visit: Payer: Self-pay | Admitting: Family Medicine

## 2023-06-19 DIAGNOSIS — M4150 Other secondary scoliosis, site unspecified: Secondary | ICD-10-CM | POA: Insufficient documentation

## 2023-06-22 ENCOUNTER — Encounter: Payer: No Typology Code available for payment source | Admitting: Family Medicine

## 2023-06-29 ENCOUNTER — Encounter: Payer: Self-pay | Admitting: Family Medicine

## 2023-06-29 ENCOUNTER — Telehealth: Payer: No Typology Code available for payment source | Admitting: Family Medicine

## 2023-06-29 VITALS — BP 134/60 | Ht 71.0 in | Wt 254.0 lb

## 2023-06-29 DIAGNOSIS — M4150 Other secondary scoliosis, site unspecified: Secondary | ICD-10-CM | POA: Diagnosis not present

## 2023-06-29 MED ORDER — TIZANIDINE HCL 4 MG PO TABS: 4.0000 mg | ORAL_TABLET | Freq: Four times a day (QID) | ORAL | 0 refills | Status: DC | PRN

## 2023-06-29 MED ORDER — DULOXETINE HCL 30 MG PO CPEP: 30.0000 mg | ORAL_CAPSULE | Freq: Every day | ORAL | 3 refills | Status: DC

## 2023-06-29 MED ORDER — HYDROCODONE-ACETAMINOPHEN 5-325 MG PO TABS: 1.0000 | ORAL_TABLET | Freq: Four times a day (QID) | ORAL | 0 refills | Status: DC | PRN

## 2023-06-29 NOTE — Assessment & Plan Note (Signed)
She continues to have significant back pain.  Considering surgical intervention.  Will add Cymbalta to see if this provides a little better pain control as well as Norco to use sparingly only as needed.  Will see if tizanidine is any more effective for her as well.

## 2023-06-29 NOTE — Progress Notes (Signed)
Sheila Gutierrez - 52 y.o. female MRN [id number]  Date of birth: 19-Feb-1971   This visit type was conducted due to national recommendations for restrictions regarding the COVID-19 Pandemic (e.g. social distancing).  This format is felt to be most appropriate for this patient at this time.  All issues noted in this document were discussed and addressed.  No physical exam was performed (except for noted visual exam findings with Video Visits).  I discussed the limitations of evaluation and management by telemedicine and the availability of in person appointments. The patient expressed understanding and agreed to proceed.  I connected withNAME@ on 06/29/23 at 11:30 AM EDT by a video enabled telemedicine application and verified that I am speaking with the correct person using two identifiers.  Present at visit: Everrett Coombe, DO [name]   Patient Location: Home [address] [place] [address]   Provider location:   [facility]  Chief Complaint  Patient presents with   Hip Pain   Back Pain    HPI  Sheila Gutierrez is a 52 y.o. female who presents via audio/video conferencing for a telehealth visit today.  She is following up for chronic low back pain.  She has seen neurosurgery as well as spine center at [facility] due to degenerative scoliosis and pain related to this.  She has tried cyclobenzaprine and meloxicam without much improvement.  Her surgeon has suggested surgical intervention based on her imaging and symptoms.  She has tried tramadol previously which did not provide very good pain control.    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   Essential hypertension, benign 12/22/2012   Obesity 11/29/2012   Type 2 diabetes mellitus (HCC) 08/06/2014     Past Surgical History:  Procedure Laterality Date   REDUCTION MAMMAPLASTY Bilateral 2010    Family History  Problem Relation Age of Onset   Pancreatic cancer Mother    Hypertension Mother        father   Hypertension Father    Breast cancer Cousin     Social History   Socioeconomic History   Marital status: Single    Spouse name: Not on file   Number of children: Not on file   Years of education: Not on file   Highest education level: 12th grade  Occupational History   Not on file  Tobacco Use   Smoking status: Never   Smokeless tobacco: Never  Substance and Sexual Activity   Alcohol use: Yes    Comment: once a month   Drug use: No   Sexual activity: Yes  Other Topics Concern   Not on file  Social History Narrative   Not on file   Social Determinants of Health   Financial Resource Strain: Low Risk  (02/07/2023)   Overall Financial Resource Strain (CARDIA)    Difficulty of Paying Living Expenses: Not very hard  Food Insecurity: No Food Insecurity (02/07/2023)   Hunger Vital Sign    Worried About Running Out of Food in the Last Year: Never true    Ran Out of Food in the Last Year: Never true  Transportation Needs: No Transportation Needs (02/07/2023)   PRAPARE - Administrator, Civil Service (Medical): No    Lack of Transportation (Non-Medical): No  Physical Activity: Sufficiently Active (02/07/2023)   Exercise Vital Sign    Days of Exercise per Week: 5 days    Minutes of Exercise per Session: 30 min  Stress: No Stress Concern Present (02/07/2023)   [employer] of Occupational Health - Occupational Stress Questionnaire    Feeling of Stress : Not at all  Social Connections: Moderately Integrated (02/07/2023)   Social Connection and Isolation Panel [NHANES]    Frequency of Communication with Friends and Family: More than three times a week    Frequency of Social Gatherings with Friends and Family: More than three times a week    Attends  Religious Services: More than 4 times per year    Active Member of [employer] or Organizations: Yes    Attends Banker Meetings: More than 4 times per year    Marital Status: Never married  Intimate Partner Violence: Unknown (12/02/2021)   Received from [employer], [facility]   HITS    Physically Hurt: Not on file    Insult or Talk Down To: Not on file    Threaten Physical Harm: Not on file    Scream or Curse: Not on file     Current Outpatient Medications:    DULoxetine (CYMBALTA) 30 MG capsule, Take 1 capsule (30 mg total) by mouth daily., Disp: 30 capsule, Rfl: 3   HYDROcodone-acetaminophen (NORCO) 5-325 MG tablet, Take 1 tablet by mouth every 6 (six) hours as needed for severe pain (pain score 7-10)., Disp: 20 tablet, Rfl: 0   losartan (COZAAR) 100 MG tablet, Take 1 tablet (100 mg total) by mouth daily., Disp: 90 tablet, Rfl: 1   metFORMIN (GLUCOPHAGE-XR) 500 MG 24 hr tablet, TAKE 3 TABLETS (1,500 MG TOTAL) BY MOUTH DAILY WITH BREAKFAST, Disp: 270 tablet, Rfl: 0   norethindrone (MICRONOR) 0.35 MG tablet, TAKE 1 TABLET BY MOUTH EVERY DAY, Disp: 84 tablet, Rfl: 0   tirzepatide (MOUNJARO) 10 MG/0.5ML Pen, INJECT 10 MG INTO THE SKIN ONE TIME PER WEEK, Disp: 2 mL, Rfl: 2   tiZANidine (ZANAFLEX) 4 MG tablet, Take 1 tablet (4 mg total) by mouth every 6 (six) hours as needed for muscle spasms., Disp: 30 tablet, Rfl: 0   amLODipine (NORVASC) 10 MG tablet, Take 1 tablet (10 mg total) by mouth daily., Disp: 90 tablet, Rfl: 1   chlorthalidone (HYGROTON) 25 MG tablet, Take 1 tablet (25 mg total) by mouth daily., Disp: 90 tablet, Rfl: 1   meloxicam (MOBIC) 15 MG tablet, TAKE 1 TABLET BY MOUTH EVERY DAY AS NEEDED FOR PAIN (Patient not taking: Reported on 06/29/2023), Disp: 30 tablet, Rfl: 1  EXAM:  VITALS per patient if applicable: BP 134/60   Ht 5\' 11"  (1.803 m)   Wt 254 lb (115.2 kg)   BMI 35.43 kg/m   GENERAL: alert, oriented, appears well and in no acute distress  HEENT:  atraumatic, conjunttiva clear, no obvious abnormalities on inspection of external nose and ears  NECK: normal movements of the head and neck  LUNGS: on inspection no signs of respiratory distress, breathing rate appears normal, no obvious gross SOB, gasping or wheezing  CV: no obvious cyanosis  MS: moves all visible extremities without noticeable abnormality  PSYCH/NEURO: pleasant and cooperative, no obvious depression or anxiety, speech and thought processing grossly intact  ASSESSMENT AND PLAN:  Discussed the following assessment and plan:  Degenerative scoliosis in adult patient She continues to have significant back pain.  Considering surgical intervention.  Will add Cymbalta to see if this  provides a little better pain control as well as Norco to use sparingly only as needed.  Will see if tizanidine is any more effective for her as well.     I discussed the assessment and treatment plan with the patient. The patient was provided an opportunity to ask questions and all were answered. The patient agreed with the plan and demonstrated an understanding of the instructions.   The patient was advised to call back or seek an in-person evaluation if the symptoms worsen or if the condition fails to improve as anticipated.    Everrett Coombe, DO

## 2023-06-30 ENCOUNTER — Encounter: Payer: Self-pay | Admitting: Family Medicine

## 2023-07-01 ENCOUNTER — Other Ambulatory Visit: Payer: Self-pay | Admitting: Family Medicine

## 2023-07-01 MED ORDER — PREDNISONE 10 MG (48) PO TBPK: ORAL_TABLET | ORAL | 0 refills | Status: DC

## 2023-07-01 NOTE — Telephone Encounter (Signed)
Pt called. She is following up on message sent yesterday.

## 2023-07-06 ENCOUNTER — Ambulatory Visit: Payer: No Typology Code available for payment source

## 2023-07-06 ENCOUNTER — Ambulatory Visit (INDEPENDENT_AMBULATORY_CARE_PROVIDER_SITE_OTHER): Payer: No Typology Code available for payment source | Admitting: Family Medicine

## 2023-07-06 VITALS — BP 97/64 | HR 123 | Ht 71.0 in | Wt 242.0 lb

## 2023-07-06 DIAGNOSIS — M25552 Pain in left hip: Secondary | ICD-10-CM | POA: Diagnosis not present

## 2023-07-06 DIAGNOSIS — R1032 Left lower quadrant pain: Secondary | ICD-10-CM | POA: Diagnosis not present

## 2023-07-06 DIAGNOSIS — M7918 Myalgia, other site: Secondary | ICD-10-CM | POA: Diagnosis not present

## 2023-07-06 MED ORDER — KETOROLAC TROMETHAMINE 60 MG/2ML IM SOLN
60.0000 mg | Freq: Once | INTRAMUSCULAR | Status: AC
  Administered 2023-07-06: 60 mg via INTRAMUSCULAR

## 2023-07-06 MED ORDER — NAPROXEN 500 MG PO TABS: 500.0000 mg | ORAL_TABLET | Freq: Two times a day (BID) | ORAL | 1 refills | Status: DC

## 2023-07-06 NOTE — Patient Instructions (Signed)
Can take 1-2 of the norco as needed.

## 2023-07-06 NOTE — Assessment & Plan Note (Signed)
Pain in L groin and buttock area.  Concern for possible labral tear.  Xrays ordered of hip.  Referral to PT.  Add naproxen to replace meloxicam.

## 2023-07-06 NOTE — Addendum Note (Signed)
Addended by: Ardyth Man on: 07/06/2023 03:05 PM   Modules accepted: Orders

## 2023-07-06 NOTE — Progress Notes (Signed)
Sheila Gutierrez - 52 y.o. female MRN [id number]  Date of birth: 02/23/1971  Subjective Chief Complaint  Patient presents with   Groin Pain   Sciatica    HPI Sheila Gutierrez is a 52 y.o. female here today for follow up of continued back/buttock pain.  Pain is more in the buttock with radiation into the L groin.  She has trouble finding comfortable position for sleep and movement seems to make the pain worse.  Doesn't recall a pop but did almost fall in the shower and symptoms started Denies numbness/tingling.  No weakness into the leg.   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   Essential hypertension, benign 12/22/2012   Obesity 11/29/2012   Type 2 diabetes mellitus (HCC) 08/06/2014    Past Surgical History:  Procedure Laterality Date   REDUCTION MAMMAPLASTY Bilateral 2010    Social History   Socioeconomic History   Marital status: Single    Spouse name: Not on file   Number of children: Not on file   Years of education: Not on file   Highest education level: Some college, no degree  Occupational History   Not on file  Tobacco Use   Smoking status: Never   Smokeless tobacco: Never  Substance and Sexual Activity   Alcohol use: Yes    Comment: once a month   Drug use: No   Sexual activity: Yes  Other Topics Concern   Not on file  Social History Narrative   Not on file   Social Determinants of Health   Financial Resource Strain: Low Risk  (07/06/2023)   Overall Financial Resource Strain (CARDIA)    Difficulty of Paying Living Expenses: Not very hard  Food Insecurity: No Food Insecurity (07/06/2023)   Hunger Vital Sign    Worried About Running Out of Food in the Last  Year: Never true    Ran Out of Food in the Last Year: Never true  Transportation Needs: No Transportation Needs (07/06/2023)   PRAPARE - Administrator, Civil Service (Medical): No    Lack of Transportation (Non-Medical): No  Physical Activity: Insufficiently Active (07/06/2023)   Exercise Vital Sign    Days of Exercise per Week: 2 days    Minutes of Exercise per Session: 20 min  Stress: No Stress Concern Present (07/06/2023)   [employer] of Occupational Health - Occupational Stress Questionnaire    Feeling of Stress : Not at all  Social Connections: Moderately Integrated (07/06/2023)   Social Connection and Isolation Panel [NHANES]    Frequency of Communication with Friends and Family: More than three times a week    Frequency of Social Gatherings with Friends and Family: Once a week    Attends Religious Services: More than 4 times per year    Active Member of [employer] or Organizations: Yes    Attends Engineer, structural: More than 4 times per year    Marital Status: Never married    Family History  Problem Relation Age of Onset   Pancreatic cancer Mother    Hypertension Mother        father   Hypertension Father    Breast cancer Cousin  Health Maintenance  Topic Date Due   Diabetic kidney evaluation - eGFR measurement  07/15/2022   Diabetic kidney evaluation - Urine ACR  03/11/2023   INFLUENZA VACCINE  11/28/2023 (Originally 03/31/2023)   HEMOGLOBIN A1C  08/11/2023   FOOT EXAM  10/15/2023   OPHTHALMOLOGY EXAM  10/21/2023   Colonoscopy  03/25/2024   MAMMOGRAM  06/07/2025   Cervical Cancer Screening (HPV/Pap Cotest)  07/15/2026   DTaP/Tdap/Td (3 - Td or Tdap) 02/08/2033   COVID-19 Vaccine  Completed   HIV Screening  Completed   Zoster Vaccines- Shingrix  Completed   HPV VACCINES  Aged Out   Hepatitis C Screening  Discontinued      ----------------------------------------------------------------------------------------------------------------------------------------------------------------------------------------------------------------- Physical Exam BP 97/64 (BP Location: Right Arm, Patient Position: Sitting, Cuff Size: Large)   Pulse (!) 123   Ht 5\' 11"  (1.803 m)   Wt 242 lb (109.8 kg)   SpO2 99%   BMI 33.75 kg/m   Physical Exam Constitutional:      Appearance: Normal appearance.  Musculoskeletal:     Comments: Pain with internal rotation of L hip.  TTP over piriformis and gluteus medius muscle on the L.   Neurological:     Mental Status: She is alert.     ------------------------------------------------------------------------------------------------------------------------------------------------------------------------------------------------------------------- Assessment and Plan  Pain of left hip Pain in L groin and buttock area.  Concern for possible labral tear.  Xrays ordered of hip.  Referral to PT.  Add naproxen to replace meloxicam.     Meds ordered this encounter  Medications   naproxen (NAPROSYN) 500 MG tablet    Sig: Take 1 tablet (500 mg total) by mouth 2 (two) times daily with a meal.    Dispense:  30 tablet    Refill:  1    No follow-ups on file.    This visit occurred during the SARS-CoV-2 public health emergency.  Safety protocols were in place, including screening questions prior to the visit, additional usage of staff PPE, and extensive cleaning of exam room while observing appropriate contact time as indicated for disinfecting solutions.

## 2023-07-20 ENCOUNTER — Encounter: Payer: Self-pay | Admitting: Family Medicine

## 2023-07-20 ENCOUNTER — Ambulatory Visit (INDEPENDENT_AMBULATORY_CARE_PROVIDER_SITE_OTHER): Payer: No Typology Code available for payment source | Admitting: Family Medicine

## 2023-07-20 VITALS — BP 115/78 | HR 109 | Ht 71.0 in | Wt 244.0 lb

## 2023-07-20 DIAGNOSIS — E119 Type 2 diabetes mellitus without complications: Secondary | ICD-10-CM

## 2023-07-20 DIAGNOSIS — I10 Essential (primary) hypertension: Secondary | ICD-10-CM | POA: Diagnosis not present

## 2023-07-20 DIAGNOSIS — Z0000 Encounter for general adult medical examination without abnormal findings: Secondary | ICD-10-CM | POA: Diagnosis not present

## 2023-07-20 DIAGNOSIS — Z7984 Long term (current) use of oral hypoglycemic drugs: Secondary | ICD-10-CM

## 2023-07-20 DIAGNOSIS — Z13220 Encounter for screening for lipoid disorders: Secondary | ICD-10-CM | POA: Diagnosis not present

## 2023-07-20 NOTE — Patient Instructions (Signed)
Preventive Care 40-52 Years Old, Female Preventive care refers to lifestyle choices and visits with your health care provider that can promote health and wellness. Preventive care visits are also called wellness exams. What can I expect for my preventive care visit? Counseling Your health care provider may ask you questions about your: Medical history, including: Past medical problems. Family medical history. Pregnancy history. Current health, including: Menstrual cycle. Method of birth control. Emotional well-being. Home life and relationship well-being. Sexual activity and sexual health. Lifestyle, including: Alcohol, nicotine or tobacco, and drug use. Access to firearms. Diet, exercise, and sleep habits. Work and work environment. Sunscreen use. Safety issues such as seatbelt and bike helmet use. Physical exam Your health care provider will check your: Height and weight. These may be used to calculate your BMI (body mass index). BMI is a measurement that tells if you are at a healthy weight. Waist circumference. This measures the distance around your waistline. This measurement also tells if you are at a healthy weight and may help predict your risk of certain diseases, such as type 2 diabetes and high blood pressure. Heart rate and blood pressure. Body temperature. Skin for abnormal spots. What immunizations do I need?  Vaccines are usually given at various ages, according to a schedule. Your health care provider will recommend vaccines for you based on your age, medical history, and lifestyle or other factors, such as travel or where you work. What tests do I need? Screening Your health care provider may recommend screening tests for certain conditions. This may include: Lipid and cholesterol levels. Diabetes screening. This is done by checking your blood sugar (glucose) after you have not eaten for a while (fasting). Pelvic exam and Pap test. Hepatitis B test. Hepatitis C  test. HIV (human immunodeficiency virus) test. STI (sexually transmitted infection) testing, if you are at risk. Lung cancer screening. Colorectal cancer screening. Mammogram. Talk with your health care provider about when you should start having regular mammograms. This may depend on whether you have a family history of breast cancer. BRCA-related cancer screening. This may be done if you have a family history of breast, ovarian, tubal, or peritoneal cancers. Bone density scan. This is done to screen for osteoporosis. Talk with your health care provider about your test results, treatment options, and if necessary, the need for more tests. Follow these instructions at home: Eating and drinking  Eat a diet that includes fresh fruits and vegetables, whole grains, lean protein, and low-fat dairy products. Take vitamin and mineral supplements as recommended by your health care provider. Do not drink alcohol if: Your health care provider tells you not to drink. You are pregnant, may be pregnant, or are planning to become pregnant. If you drink alcohol: Limit how much you have to 0-1 drink a day. Know how much alcohol is in your drink. In the [place] one drink equals one 12 oz bottle of beer (355 mL), one 5 oz glass of wine (148 mL), or one 1 oz glass of hard liquor (44 mL). Lifestyle Brush your teeth every morning and night with fluoride toothpaste. Floss one time each day. Exercise for at least 30 minutes 5 or more days each week. Do not use any products that contain nicotine or tobacco. These products include cigarettes, chewing tobacco, and vaping devices, such as e-cigarettes. If you need help quitting, ask your health care provider. Do not use drugs. If you are sexually active, practice safe sex. Use a condom or other form of protection to   prevent STIs. If you do not wish to become pregnant, use a form of birth control. If you plan to become pregnant, see your health care provider for a  prepregnancy visit. Take aspirin only as told by your health care provider. Make sure that you understand how much to take and what form to take. Work with your health care provider to find out whether it is safe and beneficial for you to take aspirin daily. Find healthy ways to manage stress, such as: Meditation, yoga, or listening to music. Journaling. Talking to a trusted person. Spending time with friends and family. Minimize exposure to UV radiation to reduce your risk of skin cancer. Safety Always wear your seat belt while driving or riding in a vehicle. Do not drive: If you have been drinking alcohol. Do not ride with someone who has been drinking. When you are tired or distracted. While texting. If you have been using any mind-altering substances or drugs. Wear a helmet and other protective equipment during sports activities. If you have firearms in your house, make sure you follow all gun safety procedures. Seek help if you have been physically or sexually abused. What's next? Visit your health care provider once a year for an annual wellness visit. Ask your health care provider how often you should have your eyes and teeth checked. Stay up to date on all vaccines. This information is not intended to replace advice given to you by your health care provider. Make sure you discuss any questions you have with your health care provider. Document Revised: 02/11/2021 Document Reviewed: 02/11/2021 Elsevier Patient Education  2024 Elsevier Inc.  

## 2023-07-20 NOTE — Progress Notes (Signed)
Sheila Gutierrez - 52 y.o. female MRN [id number]  Date of birth: [birth year]  Subjective Chief Complaint  Patient presents with   Annual Exam   Sciatica    HPI Sheila Gutierrez is a 52 y.o. female here today for annual exam.   She continues to have  back pain and buttock pain. This is slowly improving.   Activity is limited by her back pain currently.  She feels like her diet is ok.   She is a non-smoker.  Rare EtOH use.   Review of Systems  Constitutional:  Negative for chills, fever, malaise/fatigue and weight loss.  HENT:  Negative for congestion, ear pain and sore throat.   Eyes:  Negative for blurred vision, double vision and pain.  Respiratory:  Negative for cough and shortness of breath.   Cardiovascular:  Negative for chest pain and palpitations.  Gastrointestinal:  Negative for abdominal pain, blood in stool, constipation, heartburn and nausea.  Genitourinary:  Negative for dysuria and urgency.  Musculoskeletal:  Negative for joint pain and myalgias.  Neurological:  Negative for dizziness and headaches.  Endo/Heme/Allergies:  Does not bruise/bleed easily.  Psychiatric/Behavioral:  Negative for depression. The patient is not nervous/anxious and does not have insomnia.     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   Essential hypertension, benign 12/22/2012   Obesity 11/29/2012   Type 2 diabetes mellitus (HCC) 08/06/2014    Past Surgical History:  Procedure Laterality Date   REDUCTION MAMMAPLASTY Bilateral 2010    Social History   Socioeconomic History   Marital status: Single    Spouse name: Not on file   Number of children: Not on file   Years of education: Not on file   Highest education level: Some college, no degree  Occupational  History   Not on file  Tobacco Use   Smoking status: Never   Smokeless tobacco: Never  Substance and Sexual Activity   Alcohol use: Yes    Comment: once a month   Drug use: No   Sexual activity: Yes  Other Topics Concern   Not on file  Social History Narrative   Not on file   Social Determinants of Health   Financial Resource Strain: Low Risk  (07/06/2023)   Overall Financial Resource Strain (CARDIA)    Difficulty of Paying Living Expenses: Not very hard  Food Insecurity: No Food Insecurity (07/06/2023)   Hunger Vital Sign    Worried About Running Out of Food in the Last Year: Never true    Ran Out of Food in the Last Year: Never true  Transportation Needs: No Transportation Needs (07/06/2023)   PRAPARE - Administrator, Civil Service (Medical): No    Lack of Transportation (Non-Medical): No  Physical Activity: Insufficiently Active (07/06/2023)   Exercise Vital Sign    Days of Exercise per Week: 2 days    Minutes of Exercise per Session: 20 min  Stress: No Stress Concern Present (07/06/2023)   [employer] of Occupational Health - Occupational Stress Questionnaire    Feeling of Stress : Not at all  Social Connections: Moderately Integrated (07/06/2023)   Social Connection and Isolation Panel [NHANES]    Frequency of Communication with Friends and Family: More than three times a week  Frequency of Social Gatherings with Friends and Family: Once a week    Attends Religious Services: More than 4 times per year    Active Member of Clubs or Organizations: Yes    Attends Engineer, structural: More than 4 times per year    Marital Status: Never married    Family History  Problem Relation Age of Onset   Pancreatic cancer Mother    Hypertension Mother        father   Hypertension Father    Breast cancer Cousin     Health Maintenance  Topic Date Due   Diabetic kidney evaluation - eGFR measurement  07/15/2022   Diabetic kidney evaluation - Urine ACR   03/11/2023   INFLUENZA VACCINE  11/28/2023 (Originally 03/31/2023)   HEMOGLOBIN A1C  08/11/2023   FOOT EXAM  10/15/2023   OPHTHALMOLOGY EXAM  10/21/2023   Colonoscopy  03/25/2024   MAMMOGRAM  06/07/2025   Cervical Cancer Screening (HPV/Pap Cotest)  07/15/2026   DTaP/Tdap/Td (3 - Td or Tdap) 02/08/2033   COVID-19 Vaccine  Completed   HIV Screening  Completed   Zoster Vaccines- Shingrix  Completed   HPV VACCINES  Aged Out   Hepatitis C Screening  Discontinued     ----------------------------------------------------------------------------------------------------------------------------------------------------------------------------------------------------------------- Physical Exam BP 115/78 (BP Location: Right Arm, Patient Position: Sitting, Cuff Size: Large)   Pulse (!) 109   Ht 5\' 11"  (1.803 m)   Wt 244 lb (110.7 kg)   SpO2 98%   BMI 34.03 kg/m   Physical Exam Constitutional:      General: She is not in acute distress. HENT:     Head: Normocephalic and atraumatic.     Right Ear: Tympanic membrane and ear canal normal.     Left Ear: Tympanic membrane and ear canal normal.     Nose: Nose normal.  Eyes:     General: No scleral icterus.    Conjunctiva/sclera: Conjunctivae normal.  Neck:     Thyroid: No thyromegaly.  Cardiovascular:     Rate and Rhythm: Normal rate and regular rhythm.     Heart sounds: Normal heart sounds.  Pulmonary:     Effort: Pulmonary effort is normal.     Breath sounds: Normal breath sounds.  Abdominal:     General: Bowel sounds are normal. There is no distension.     Palpations: Abdomen is soft.     Tenderness: There is no abdominal tenderness. There is no guarding.  Musculoskeletal:        General: Normal range of motion.     Cervical back: Normal range of motion and neck supple.  Lymphadenopathy:     Cervical: No cervical adenopathy.  Skin:    General: Skin is warm and dry.     Findings: No rash.  Neurological:     General: No focal  deficit present.     Mental Status: She is alert and oriented to person, place, and time.     Cranial Nerves: No cranial nerve deficit.     Coordination: Coordination normal.  Psychiatric:        Mood and Affect: Mood normal.        Behavior: Behavior normal.     ------------------------------------------------------------------------------------------------------------------------------------------------------------------------------------------------------------------- Assessment and Plan  Well adult exam Well adult Orders Placed This Encounter  Procedures   CBC with Differential/Platelet   CMP14+EGFR   Lipid Panel With LDL/HDL Ratio   Urine Microalbumin w/creat. ratio   HgB A1c  Screenings: UTD Immunizations:  UTD. Anticipatory guidance/Risk factor reduction:  Recommendations per AVS.    No orders of the defined types were placed in this encounter.   No follow-ups on file.    This visit occurred during the SARS-CoV-2 public health emergency.  Safety protocols were in place, including screening questions prior to the visit, additional usage of staff PPE, and extensive cleaning of exam room while observing appropriate contact time as indicated for disinfecting solutions.

## 2023-07-20 NOTE — Assessment & Plan Note (Addendum)
Well adult Orders Placed This Encounter  Procedures   CBC with Differential/Platelet   CMP14+EGFR   Lipid Panel With LDL/HDL Ratio   Urine Microalbumin w/creat. ratio   HgB A1c  Screenings: UTD Immunizations:  UTD. Anticipatory guidance/Risk factor reduction:  Recommendations per AVS.

## 2023-07-21 ENCOUNTER — Other Ambulatory Visit: Payer: Self-pay

## 2023-07-21 ENCOUNTER — Other Ambulatory Visit: Payer: Self-pay | Admitting: Family Medicine

## 2023-07-21 MED ORDER — CYCLOBENZAPRINE HCL 10 MG PO TABS: 10.0000 mg | ORAL_TABLET | Freq: Three times a day (TID) | ORAL | 1 refills | Status: AC | PRN

## 2023-07-21 NOTE — Addendum Note (Signed)
Addended by: Mammie Lorenzo on: 07/21/2023 03:40 PM   Modules accepted: Orders

## 2023-07-22 MED ORDER — HYDROCODONE-ACETAMINOPHEN 5-325 MG PO TABS: 1.0000 | ORAL_TABLET | Freq: Four times a day (QID) | ORAL | 0 refills | Status: AC | PRN

## 2023-07-23 ENCOUNTER — Other Ambulatory Visit: Payer: Self-pay | Admitting: Family Medicine

## 2023-07-23 LAB — CBC WITH DIFFERENTIAL/PLATELET
Basophils Absolute: 0.1 10*3/uL (ref 0.0–0.2)
Basos: 1 %
Eos: 6 %
Hematocrit: 40.3 % (ref 34.0–46.6)
Hemoglobin: 13 g/dL (ref 11.1–15.9)
Immature Grans (Abs): 0 10*3/uL (ref 0.0–0.1)
Immature Granulocytes: 0 %
Lymphocytes Absolute: 2.5 10*3/uL (ref 0.7–3.1)
Lymphs: 30 %
MCH: 29.7 pg (ref 26.6–33.0)
MCHC: 32.3 g/dL (ref 31.5–35.7)
MCV: 92 fL (ref 79–97)
Monocytes Absolute: 0.5 10*3/uL — ABNORMAL HIGH (ref 0.0–0.4)
Monocytes Absolute: 0.7 10*3/uL (ref 0.1–0.9)
Monocytes: 8 %
Neutrophils Absolute: 4.7 10*3/uL (ref 1.4–7.0)
Neutrophils: 55 %
Platelets: 201 10*3/uL (ref 150–450)
RBC: 4.37 x10E6/uL (ref 3.77–5.28)
RDW: 13.4 % (ref 11.7–15.4)
WBC: 8.6 10*3/uL (ref 3.4–10.8)

## 2023-07-23 LAB — LIPID PANEL WITH LDL/HDL RATIO
Cholesterol, Total: 164 mg/dL (ref 100–199)
HDL: 61 mg/dL (ref >39)
LDL Chol Calc (NIH): 86 mg/dL (ref 0–99)
LDL/HDL Ratio: 1.4 ratio (ref 0.0–3.2)
Triglycerides: 95 mg/dL (ref 0–149)
VLDL Cholesterol Cal: 17 mg/dL (ref 5–40)

## 2023-07-23 LAB — CMP14+EGFR
ALT: 43 [IU]/L — ABNORMAL HIGH (ref 0–32)
AST: 30 [IU]/L (ref 0–40)
Albumin: 4.4 g/dL (ref 3.8–4.9)
Alkaline Phosphatase: 121 [IU]/L (ref 44–121)
BUN/Creatinine Ratio: 18 (ref 9–23)
BUN: 24 mg/dL (ref 6–24)
Bilirubin Total: 0.6 mg/dL (ref 0.0–1.2)
CO2: 19 mmol/L — ABNORMAL LOW (ref 20–29)
Calcium: 10 mg/dL (ref 8.7–10.2)
Chloride: 105 mmol/L (ref 96–106)
Creatinine, Ser: 1.34 mg/dL — ABNORMAL HIGH (ref 0.57–1.00)
Globulin, Total: 2.6 g/dL (ref 1.5–4.5)
Glucose: 85 mg/dL (ref 70–99)
Potassium: 4.6 mmol/L (ref 3.5–5.2)
Sodium: 143 mmol/L (ref 134–144)
Total Protein: 7 g/dL (ref 6.0–8.5)
eGFR: 48 mL/min/{1.73_m2} — ABNORMAL LOW (ref >59)

## 2023-07-23 LAB — MICROALBUMIN / CREATININE URINE RATIO
Creatinine, Urine: 183.9 mg/dL
Microalb/Creat Ratio: 28 mg/g{creat} (ref 0–29)
Microalbumin, Urine: 52 ug/mL

## 2023-07-23 LAB — HEMOGLOBIN A1C

## 2023-07-27 ENCOUNTER — Ambulatory Visit: Payer: No Typology Code available for payment source | Attending: Family Medicine | Admitting: Physical Therapy

## 2023-07-27 ENCOUNTER — Other Ambulatory Visit: Payer: Self-pay

## 2023-07-27 ENCOUNTER — Encounter: Payer: Self-pay | Admitting: Physical Therapy

## 2023-07-27 DIAGNOSIS — M25552 Pain in left hip: Secondary | ICD-10-CM | POA: Diagnosis present

## 2023-07-27 DIAGNOSIS — M6281 Muscle weakness (generalized): Secondary | ICD-10-CM | POA: Insufficient documentation

## 2023-07-27 DIAGNOSIS — M7918 Myalgia, other site: Secondary | ICD-10-CM | POA: Diagnosis not present

## 2023-07-27 NOTE — Therapy (Signed)
OUTPATIENT PHYSICAL THERAPY LOWER EXTREMITY EVALUATION   Patient Name: Sheila Gutierrez MRN: [id number] DOB:28-Sep-1970, 52 y.o., female Today's Date: 07/27/2023  END OF SESSION:  PT End of Session - 07/27/23 1353     Visit Number 1    Number of Visits 16    Date for PT Re-Evaluation 09/21/23    Authorization Type [employer]    PT Start Time 1145    PT Stop Time 1221    PT Time Calculation (min) 36 min    Activity Tolerance Patient tolerated treatment well    Behavior During Therapy [facility] for tasks assessed/performed             Past Medical History:  Diagnosis Date   Abnormal mammogram 11/29/2012   Repeat left diagnostic September 2014    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   Essential hypertension, benign 12/22/2012   Obesity 11/29/2012   Type 2 diabetes mellitus (HCC) 08/06/2014   Past Surgical History:  Procedure Laterality Date   REDUCTION MAMMAPLASTY Bilateral 2010   Patient Active Problem List   Diagnosis Date Noted   Pain of left hip 07/06/2023   Degenerative scoliosis in adult patient 06/19/2023   COVID-19 03/09/2023   Lumbar radiculopathy 02/09/2023   Well adult exam 06/11/2020   Abnormal menses 06/11/2020   Lumbar spondylosis 01/09/2020   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   Transaminitis 07/27/2017   Type 2 diabetes mellitus (HCC) 08/06/2014   Essential hypertension, benign 12/22/2012   Abnormal mammogram 11/29/2012   History of bilateral breast reduction surgery 11/29/2012   Morbid obesity (HCC) 11/29/2012   History of abnormal Pap smear 11/29/2012    PCP: Everrett Coombe  REFERRING PROVIDER: Everrett Coombe  REFERRING DIAG: Lt hip pain  THERAPY DIAG:  Pain in left hip  Muscle weakness (generalized)  Rationale for Evaluation and Treatment: Rehabilitation  ONSET DATE: 05/2023  SUBJECTIVE:   SUBJECTIVE STATEMENT: Pt states that she has had Lt hip/glute pain for  about 3-4 weeks. Insidious onset. Pain increases when she walks or stands. She states she has pain in glutes and "heaviness" in Lt LE. Pt also reports she feels pain/pressure when sitting on Lt buttocks. She has difficulty sleeping due to pain. Pain decreases with stretching and ice.  PERTINENT HISTORY: None reported PAIN:  Are you having pain? Yes: NPRS scale: 8-9/10 Pain location: Lt glutes Pain description: heavy, sore, ache Aggravating factors: walk, stand, sit Relieving factors: stretch, ice  PRECAUTIONS: None  RED FLAGS: None   WEIGHT BEARING RESTRICTIONS: No  FALLS:  Has patient fallen in last 6 months? No   OCCUPATION: work from home, sits all day  PLOF: Independent  PATIENT GOALS: decrease pain  NEXT MD VISIT: PRN  OBJECTIVE:  Note: Objective measures were completed at Evaluation unless otherwise noted.  DIAGNOSTIC FINDINGS: x ray: No acute osseous abnormality. Mild degenerative changes of the hips.   PATIENT SURVEYS:  FOTO 36     SENSATION: WFL   MUSCLE LENGTH: Hamstrings: Right 110 deg; Left 90 deg   POSTURE: pt sits with all pressure on Rt buttocks due to Lt glute pain  PALPATION: TTP Lt greater trochanter, Lt piriformis, Lt glutes, Lt SIJ lateral border   Hip PROM: grossly WFL  LOWER EXTREMITY MMT:  MMT Right eval Left eval  Hip flexion 3+ 3- pain  Hip extension    Hip abduction  3 pain  Hip adduction    Hip internal rotation    Hip external  rotation  3 pain  Knee flexion    Knee extension    Ankle dorsiflexion    Ankle plantarflexion    Ankle inversion    Ankle eversion     (Blank rows = not tested)  LOWER EXTREMITY SPECIAL TESTS:  SLR negative bilat     TODAY'S TREATMENT:                                                                                                                              [facility] Adult PT Treatment:                                                DATE: 07/27/23 Therapeutic Exercise: See HEP Manual  Therapy: STM Lt glutes/piriformis Trigger Point Dry-Needling  Treatment instructions: Expect mild to moderate muscle soreness. S/S of pneumothorax if dry needled over a lung field, and to seek immediate medical attention should they occur. Patient verbalized understanding of these instructions and education.  Patient Consent Given: Yes Education handout provided: Yes Muscles treated: Lt piriformis Electrical stimulation performed: No Parameters: N/A Treatment response/outcome: increased muscle length  Modalities: Tens Lt glutes x 10 mins to tolerance Ice pack Lt glutes x 10 mins     PATIENT EDUCATION:  Education details: PT POC and goals, HEP, dry needling Person educated: Patient Education method: Explanation, Demonstration, and Handouts Education comprehension: verbalized understanding and returned demonstration  HOME EXERCISE PROGRAM: Access Code: [email or web address] URL: [email or web address] Date: 07/27/2023 Prepared by: Reggy Eye  Exercises - Hooklying Heel Slide  - 1 x daily - 7 x weekly - 3 sets - 10 reps - Bent Knee Fallouts  - 1 x daily - 7 x weekly - 3 sets - 10 reps - Supine Hip Adduction Isometric with Ball  - 1 x daily - 7 x weekly - 2 sets - 10 reps - 3 seconds hold - Supine Figure 4 Piriformis Stretch  - 1 x daily - 7 x weekly - 1 sets - 3 reps - 20-30 seconds hold  ASSESSMENT:  CLINICAL IMPRESSION: Patient is a 52 y.o. female who was seen today for physical therapy evaluation and treatment for Lt hip and piriformis pain. She presents with increased muscle spasticity and increased TTP, decreased functional mobility, decreased activity tolerance, decreased strength and increased pain. She will benefit from skilled PT to address deficits and improve functional mobility with decreased pain.   OBJECTIVE IMPAIRMENTS: decreased activity tolerance, difficulty walking, decreased strength, increased muscle spasms, and pain.   ACTIVITY LIMITATIONS: sitting,  standing, sleeping, and locomotion level  PARTICIPATION LIMITATIONS: cleaning, laundry, shopping, community activity, and occupation  PERSONAL FACTORS: Time since onset of injury/illness/exacerbation are also affecting patient's functional outcome.   REHAB POTENTIAL: Good  CLINICAL DECISION MAKING: Stable/uncomplicated  EVALUATION COMPLEXITY: Low   GOALS: Goals reviewed with patient? Yes  SHORT  TERM GOALS: Target date: 08/24/2023   Pt will be independent with initial HEP Baseline: Goal status: INITIAL  2.  Pt will improve FOTO to >=45 Baseline:  Goal status: INITIAL    LONG TERM GOALS: Target date: 09/21/2023    Pt will be independent with advanced HEP Baseline:  Goal status: INITIAL  2.  Pt will improve FOTO to >= 62 Baseline:  Goal status: INITIAL  3.  Pt will improve bilat LE strength to 4+/5 to return to exercise for fitness Baseline:  Goal status: INITIAL  4.  Pt will sleep x 6 hours without being interrupted by pain Baseline:  Goal status: INITIAL  5.  Pt will walk x 10 minutes with pain <= 3/10 Baseline:  Goal status: INITIAL   PLAN:  PT FREQUENCY: 1-2x/week  PT DURATION: 8 weeks  PLANNED INTERVENTIONS: 97164- PT Re-evaluation, 97110-Therapeutic exercises, 97530- Therapeutic activity, [id number]- Neuromuscular re-education, 97535- Self Care, 16109- Manual therapy, [id number]- Aquatic Therapy, 97014- Electrical stimulation (unattended), [id number]- Ultrasound, 60454- Ionotophoresis 4mg /ml Dexamethasone, Patient/Family education, Taping, Dry Needling, Cryotherapy, and Moist heat  PLAN FOR NEXT SESSION: Progress hip strength as tolerated, manual/modalities as indicated   Brian Kocourek, PT 07/27/2023, 1:55 PM

## 2023-08-03 ENCOUNTER — Ambulatory Visit: Payer: No Typology Code available for payment source | Attending: Family Medicine | Admitting: Physical Therapy

## 2023-08-03 ENCOUNTER — Encounter: Payer: Self-pay | Admitting: Physical Therapy

## 2023-08-03 DIAGNOSIS — M25552 Pain in left hip: Secondary | ICD-10-CM | POA: Insufficient documentation

## 2023-08-03 DIAGNOSIS — M6281 Muscle weakness (generalized): Secondary | ICD-10-CM | POA: Diagnosis present

## 2023-08-03 NOTE — Therapy (Signed)
OUTPATIENT PHYSICAL THERAPY LOWER EXTREMITY TREATMENT   Patient Name: Sheila Gutierrez MRN: [id number] DOB:11-29-1970, 52 y.o., female Today's Date: 08/03/2023  END OF SESSION:  PT End of Session - 08/03/23 1005     Visit Number 2    Number of Visits 16    Date for PT Re-Evaluation 09/21/23    Authorization Type [employer]    PT Start Time [phone]    PT Stop Time 1008    PT Time Calculation (min) 40 min    Activity Tolerance Patient tolerated treatment well    Behavior During Therapy [facility] for tasks assessed/performed              Past Medical History:  Diagnosis Date   Abnormal mammogram 11/29/2012   Repeat left diagnostic September 2014    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   Essential hypertension, benign 12/22/2012   Obesity 11/29/2012   Type 2 diabetes mellitus (HCC) 08/06/2014   Past Surgical History:  Procedure Laterality Date   REDUCTION MAMMAPLASTY Bilateral 2010   Patient Active Problem List   Diagnosis Date Noted   Pain of left hip 07/06/2023   Degenerative scoliosis in adult patient 06/19/2023   COVID-19 03/09/2023   Lumbar radiculopathy 02/09/2023   Well adult exam 06/11/2020   Abnormal menses 06/11/2020   Lumbar spondylosis 01/09/2020   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 She  still has 8/10 pain today, especially when sitting or laying on Lt buttocks  PERTINENT HISTORY: Pt states that she has had Lt hip/glute pain for about 3-4 weeks. Insidious onset. Pain increases when she walks or stands. She states she has pain in glutes and "heaviness" in Lt LE. Pt also reports she feels pain/pressure when sitting on Lt buttocks. She has difficulty sleeping due to pain. Pain decreases with stretching and ice. PAIN:  Are you having pain? Yes: NPRS scale: 8-9/10 Pain location: Lt glutes Pain description: heavy, sore, ache Aggravating factors: walk, stand, sit Relieving factors: stretch, ice  PRECAUTIONS: None  RED FLAGS: None   WEIGHT BEARING RESTRICTIONS: No  FALLS:  Has patient fallen in last 6 months? No   OCCUPATION: work from home, sits all day  PLOF: Independent  PATIENT GOALS: decrease pain  NEXT MD VISIT: PRN  OBJECTIVE:  Note: Objective measures were completed at Evaluation unless otherwise noted.  DIAGNOSTIC FINDINGS: x ray: No acute osseous abnormality. Mild degenerative changes of the hips.   PATIENT SURVEYS:  FOTO 36   MUSCLE LENGTH: Hamstrings: Right 110 deg; Left 90 deg   POSTURE: pt sits with all pressure on Rt buttocks due to Lt glute pain  PALPATION: TTP Lt greater trochanter, Lt piriformis, Lt glutes, Lt SIJ lateral border   Hip PROM: grossly WFL  LOWER EXTREMITY MMT:  MMT Right eval Left eval  Hip flexion 3+ 3- pain  Hip extension    Hip abduction  3  pain  Hip adduction    Hip internal rotation    Hip external rotation  3 pain  Knee flexion    Knee extension    Ankle dorsiflexion    Ankle plantarflexion    Ankle inversion    Ankle eversion     (Blank rows = not tested)  LOWER EXTREMITY SPECIAL TESTS:  SLR negative bilat     TODAY'S TREATMENT:                                                                                                                              [facility] Adult PT Treatment:                                                 DATE: 08/03/23 Therapeutic Exercise: Heel slides 2 x 10 Bent knee fall outs x 10 Hip adduction ball squeeze 2 x 10 Figure 4 piriformis stretch 2 x 30 sec Supine hip abd 2 x 10 Sidelying clam 2 x 10 Supine adductor squeeze with alternating knee ext x 10 Standing hip circle on slider x 10 - Lt LE only  Manual Therapy: STM Lt glutes/piriformis Trigger Point Dry-Needling  Treatment instructions: Expect mild to moderate muscle soreness. S/S of pneumothorax if dry needled over a lung field, and to seek immediate medical attention should they occur. Patient verbalized understanding of these instructions and education. Patient Consent Given: Yes Education handout provided: Previously provided Muscles treated: Lt piriformis Electrical stimulation performed: No Parameters: N/A Treatment response/outcome: increased muscle length  Modalities: Ice pack x 10 mins Lt glutes/piriformis   [facility] Adult PT Treatment:                                                DATE: 07/27/23 Therapeutic Exercise: See HEP Manual Therapy: STM Lt glutes/piriformis Trigger Point Dry-Needling  Treatment instructions: Expect mild to moderate muscle soreness. S/S of pneumothorax if dry needled over a lung field, and to seek immediate medical attention should they occur. Patient verbalized understanding of these instructions and education.  Patient Consent Given: Yes Education handout provided: Yes Muscles treated: Lt piriformis Electrical stimulation performed: No Parameters: N/A Treatment response/outcome: increased muscle length  Modalities: Tens Lt glutes x 10 mins to tolerance Ice pack Lt glutes x 10 mins     PATIENT EDUCATION:  Education details: PT POC and goals, HEP, dry needling Person educated: Patient Education method: Explanation, Demonstration, and Handouts Education comprehension: verbalized understanding and returned demonstration  HOME EXERCISE PROGRAM: Access  Code: [email or web address] URL: [email or web address] Date: 07/27/2023 Prepared by: Reggy Eye  Exercises - Hooklying Heel Slide  - 1 x daily - 7 x weekly - 3 sets - 10 reps -  Bent Knee Fallouts  - 1 x daily - 7 x weekly - 3 sets - 10 reps - Supine Hip Adduction Isometric with Ball  - 1 x daily - 7 x weekly - 2 sets - 10 reps - 3 seconds hold - Supine Figure 4 Piriformis Stretch  - 1 x daily - 7 x weekly - 1 sets - 3 reps - 20-30 seconds hold  ASSESSMENT:  CLINICAL IMPRESSION: Pt has improved ability to perform heel slides and was able to tolerate sidelying clam today. Still some increased pain with sidelying abduction, added standing hip circles with no increase in pain. Continued dry needling due to good response after last visit  OBJECTIVE IMPAIRMENTS: decreased activity tolerance, difficulty walking, decreased strength, increased muscle spasms, and pain.     GOALS: Goals reviewed with patient? Yes  SHORT TERM GOALS: Target date: 08/24/2023   Pt will be independent with initial HEP Baseline: Goal status: INITIAL  2.  Pt will improve FOTO to >=45 Baseline:  Goal status: INITIAL    LONG TERM GOALS: Target date: 09/21/2023    Pt will be independent with advanced HEP Baseline:  Goal status: INITIAL  2.  Pt will improve FOTO to >= 62 Baseline:  Goal status: INITIAL  3.  Pt will improve bilat LE strength to 4+/5 to return to exercise for fitness Baseline:  Goal status: INITIAL  4.  Pt will sleep x 6 hours without being interrupted by pain Baseline:  Goal status: INITIAL  5.  Pt will walk x 10 minutes with pain <= 3/10 Baseline:  Goal status: INITIAL   PLAN:  PT FREQUENCY: 1-2x/week  PT DURATION: 8 weeks  PLANNED INTERVENTIONS: 97164- PT Re-evaluation, 97110-Therapeutic exercises, 97530- Therapeutic activity, [id number]- Neuromuscular re-education, 97535- Self Care, [id number]- Manual therapy, [id number]- Aquatic Therapy, 97014- Electrical stimulation  (unattended), 97035- Ultrasound, 95621- Ionotophoresis 4mg /ml Dexamethasone, Patient/Family education, Taping, Dry Needling, Cryotherapy, and Moist heat  PLAN FOR NEXT SESSION: update HEP, Progress hip strength as tolerated, manual/modalities as indicated   Jasman Pfeifle, PT 08/03/2023, 10:05 AM

## 2023-08-05 ENCOUNTER — Other Ambulatory Visit: Payer: Self-pay | Admitting: Family Medicine

## 2023-08-05 DIAGNOSIS — E119 Type 2 diabetes mellitus without complications: Secondary | ICD-10-CM

## 2023-08-05 DIAGNOSIS — I10 Essential (primary) hypertension: Secondary | ICD-10-CM

## 2023-08-10 ENCOUNTER — Ambulatory Visit: Payer: No Typology Code available for payment source | Admitting: Physical Therapy

## 2023-08-16 ENCOUNTER — Other Ambulatory Visit: Payer: Self-pay | Admitting: Family Medicine

## 2023-08-16 DIAGNOSIS — E119 Type 2 diabetes mellitus without complications: Secondary | ICD-10-CM

## 2023-09-04 ENCOUNTER — Other Ambulatory Visit: Payer: Self-pay | Admitting: Family Medicine

## 2023-09-07 ENCOUNTER — Encounter: Payer: Self-pay | Admitting: Physical Therapy

## 2023-09-07 ENCOUNTER — Ambulatory Visit: Payer: No Typology Code available for payment source | Attending: Family Medicine | Admitting: Physical Therapy

## 2023-09-07 DIAGNOSIS — M25552 Pain in left hip: Secondary | ICD-10-CM | POA: Insufficient documentation

## 2023-09-07 DIAGNOSIS — M6281 Muscle weakness (generalized): Secondary | ICD-10-CM | POA: Diagnosis present

## 2023-09-07 NOTE — Therapy (Signed)
 OUTPATIENT PHYSICAL THERAPY LOWER EXTREMITY TREATMENT   Patient Name: Sheila Gutierrez MRN: [id number] DOB:[birth year], 53 y.o., female Today's Date: 09/07/2023  END OF SESSION:  PT End of Session - 09/07/23 1440     Visit Number 3    Number of Visits 16    Date for PT Re-Evaluation 09/21/23    Authorization Type [employer]    PT Start Time 1400    PT Stop Time 1445    PT Time Calculation (min) 45 min    Activity Tolerance Patient tolerated treatment well    Behavior During Therapy [facility] for tasks assessed/performed               Past Medical History:  Diagnosis Date   Abnormal mammogram 11/29/2012   Repeat left diagnostic September 2014    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   Essential hypertension, benign 12/22/2012   Obesity 11/29/2012   Type 2 diabetes mellitus (HCC) 08/06/2014   Past Surgical History:  Procedure Laterality Date   REDUCTION MAMMAPLASTY Bilateral 2010   Patient Active Problem List   Diagnosis Date Noted   Pain of left hip 07/06/2023   Degenerative scoliosis in adult patient 06/19/2023   COVID-19 03/09/2023   Lumbar radiculopathy 02/09/2023   Well adult exam 06/11/2020   Abnormal menses 06/11/2020   Lumbar spondylosis 01/09/2020   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   Transaminitis 07/27/2017   Type 2 diabetes mellitus (HCC) 08/06/2014   Essential hypertension, benign 12/22/2012   Abnormal mammogram 11/29/2012   History of bilateral breast reduction surgery 11/29/2012   Morbid obesity (HCC) 11/29/2012   History of abnormal Pap smear 11/29/2012    PCP: Alvia Bring  REFERRING PROVIDER: Alvia Bring  REFERRING DIAG: Lt hip pain  THERAPY DIAG:  Pain in left hip  Muscle weakness (generalized)  Rationale for Evaluation and Treatment: Rehabilitation  ONSET DATE: 05/2023  SUBJECTIVE:   SUBJECTIVE STATEMENT: Pt states she continues with pain after laying or  sitting for long periods of time  PERTINENT HISTORY: Pt states that she has had Lt hip/glute pain for about 3-4 weeks. Insidious onset. Pain increases when she walks or stands. She states she has pain in glutes and heaviness in Lt LE. Pt also reports she feels pain/pressure when sitting on Lt buttocks. She has difficulty sleeping due to pain. Pain decreases with stretching and ice. PAIN:  Are you having pain? Yes: NPRS scale: 7/10 Pain location: Lt glutes Pain description: heavy, sore, ache Aggravating factors: walk, stand, sit Relieving factors: stretch, ice  PRECAUTIONS: None  RED FLAGS: None   WEIGHT BEARING RESTRICTIONS: No  FALLS:  Has patient fallen in last 6 months? No   OCCUPATION: work from home, sits all day  PLOF: Independent  PATIENT GOALS: decrease pain  NEXT MD VISIT: PRN  OBJECTIVE:  Note: Objective measures were completed at Evaluation unless otherwise noted.  DIAGNOSTIC FINDINGS: x ray: No acute osseous abnormality. Mild degenerative changes of the hips.   PATIENT SURVEYS:  FOTO 36   MUSCLE LENGTH: Hamstrings: Right 110 deg; Left 90 deg   POSTURE: pt sits with all pressure on Rt buttocks due to Lt glute pain  PALPATION: TTP Lt greater trochanter, Lt piriformis, Lt glutes, Lt SIJ lateral border   Hip PROM: grossly WFL  LOWER EXTREMITY MMT:  MMT Right eval Left eval  Hip flexion 3+ 3- pain  Hip extension    Hip abduction  3 pain  Hip adduction  Hip internal rotation    Hip external rotation  3 pain  Knee flexion    Knee extension    Ankle dorsiflexion    Ankle plantarflexion    Ankle inversion    Ankle eversion     (Blank rows = not tested)  LOWER EXTREMITY SPECIAL TESTS:  SLR negative bilat     TODAY'S TREATMENT:                                                                                                                              [facility] Adult PT Treatment:                                                DATE:  09/07/23 Therapeutic Exercise: Heel slides x 10 bilat Hip add ball squeeze 15 x 3 sec hold Hooklying marching 2 x 10 Figure 4 piriformis stretch 2 x 30 sec Sidelying clam x 20 bilat Squat tap x 10 Standing hip circles on slider 2 x 10 bilat Manual Therapy: STM Lt glutes and piriformis Trigger Point Dry Needling  Subsequent Treatment: Instructions provided previously at initial dry needling treatment.   Patient Verbal Consent Given: Yes Education Handout Provided: Previously Provided Muscles Treated: Lt glutes, piriformis Electrical Stimulation Performed: No Treatment Response/Outcome: twitch response  Modalities: Ice pack Lt glutes x 10 minutes   [facility] Adult PT Treatment:                                                DATE: 08/03/23 Therapeutic Exercise: Heel slides 2 x 10 Bent knee fall outs x 10 Hip adduction ball squeeze 2 x 10 Figure 4 piriformis stretch 2 x 30 sec Supine hip abd 2 x 10 Sidelying clam 2 x 10 Supine adductor squeeze with alternating knee ext x 10 Standing hip circle on slider x 10 - Lt LE only  Manual Therapy: STM Lt glutes/piriformis Trigger Point Dry-Needling  Treatment instructions: Expect mild to moderate muscle soreness. S/S of pneumothorax if dry needled over a lung field, and to seek immediate medical attention should they occur. Patient verbalized understanding of these instructions and education. Patient Consent Given: Yes Education handout provided: Previously provided Muscles treated: Lt piriformis Electrical stimulation performed: No Parameters: N/A Treatment response/outcome: increased muscle length  Modalities: Ice pack x 10 mins Lt glutes/piriformis   [facility] Adult PT Treatment:                                                DATE: 07/27/23 Therapeutic Exercise: See HEP Manual Therapy: STM  Lt glutes/piriformis Trigger Point Dry-Needling  Treatment instructions: Expect mild to moderate muscle soreness. S/S of pneumothorax if dry  needled over a lung field, and to seek immediate medical attention should they occur. Patient verbalized understanding of these instructions and education.  Patient Consent Given: Yes Education handout provided: Yes Muscles treated: Lt piriformis Electrical stimulation performed: No Parameters: N/A Treatment response/outcome: increased muscle length  Modalities: Tens Lt glutes x 10 mins to tolerance Ice pack Lt glutes x 10 mins     PATIENT EDUCATION:  Education details: PT POC and goals, HEP, dry needling Person educated: Patient Education method: Explanation, Demonstration, and Handouts Education comprehension: verbalized understanding and returned demonstration  HOME EXERCISE PROGRAM: Access Code: [email or web address] URL: [email or web address] Date: 09/07/2023 Prepared by: Darice Conine  Exercises - Hooklying Heel Slide  - 1 x daily - 7 x weekly - 3 sets - 10 reps - Bent Knee Fallouts  - 1 x daily - 7 x weekly - 3 sets - 10 reps - Supine Hip Adduction Isometric with Ball  - 1 x daily - 7 x weekly - 2 sets - 10 reps - 3 seconds hold - Supine Figure 4 Piriformis Stretch  - 1 x daily - 7 x weekly - 1 sets - 3 reps - 20-30 seconds hold - Supine March  - 1 x daily - 7 x weekly - 3 sets - 10 reps - Clamshell  - 1 x daily - 7 x weekly - 3 sets - 10 reps - Standing Piriformis Release with Ball at Wall  - 1 x daily - 7 x weekly - 1 sets - 1 reps  ASSESSMENT:  CLINICAL IMPRESSION: Pt with improved tolerance to exercise today. Upgraded HEP to progress hip strengthening. Continued dry needling as pt gets good pain relief with this.  OBJECTIVE IMPAIRMENTS: decreased activity tolerance, difficulty walking, decreased strength, increased muscle spasms, and pain.     GOALS: Goals reviewed with patient? Yes  SHORT TERM GOALS: Target date: 08/24/2023   Pt will be independent with initial HEP Baseline: Goal status: IN PROGRESS  2.  Pt will improve FOTO to >=45 Baseline:   Goal status: INITIAL    LONG TERM GOALS: Target date: 09/21/2023    Pt will be independent with advanced HEP Baseline:  Goal status: INITIAL  2.  Pt will improve FOTO to >= 62 Baseline:  Goal status: INITIAL  3.  Pt will improve bilat LE strength to 4+/5 to return to exercise for fitness Baseline:  Goal status: INITIAL  4.  Pt will sleep x 6 hours without being interrupted by pain Baseline:  Goal status: INITIAL  5.  Pt will walk x 10 minutes with pain <= 3/10 Baseline:  Goal status: INITIAL   PLAN:  PT FREQUENCY: 1-2x/week  PT DURATION: 8 weeks  PLANNED INTERVENTIONS: 97164- PT Re-evaluation, 97110-Therapeutic exercises, 97530- Therapeutic activity, [id number]- Neuromuscular re-education, 97535- Self Care, 02859- Manual therapy, [id number]- Aquatic Therapy, 97014- Electrical stimulation (unattended), 97035- Ultrasound, 02966- Ionotophoresis 4mg /ml Dexamethasone , Patient/Family education, Taping, Dry Needling, Cryotherapy, and Moist heat  PLAN FOR NEXT SESSION: Progress hip strength as tolerated, manual/modalities as indicated   Moshe Wenger, PT 09/07/2023, 2:41 PM

## 2023-09-08 LAB — BASIC METABOLIC PANEL
BUN/Creatinine Ratio: 20 (ref 9–23)
BUN: 25 mg/dL — ABNORMAL HIGH (ref 6–24)
CO2: 20 mmol/L (ref 20–29)
Calcium: 9.5 mg/dL (ref 8.7–10.2)
Chloride: 106 mmol/L (ref 96–106)
Creatinine, Ser: 1.25 mg/dL — ABNORMAL HIGH (ref 0.57–1.00)
Glucose: 80 mg/dL (ref 70–99)
Potassium: 3.9 mmol/L (ref 3.5–5.2)
Sodium: 144 mmol/L (ref 134–144)
eGFR: 52 mL/min/{1.73_m2} — ABNORMAL LOW (ref >59)

## 2023-09-14 ENCOUNTER — Ambulatory Visit: Payer: No Typology Code available for payment source | Admitting: Physical Therapy

## 2023-09-14 ENCOUNTER — Ambulatory Visit: Payer: No Typology Code available for payment source | Admitting: Family Medicine

## 2023-09-14 VITALS — BP 112/75 | HR 111 | Ht 71.0 in | Wt 236.5 lb

## 2023-09-14 DIAGNOSIS — E119 Type 2 diabetes mellitus without complications: Secondary | ICD-10-CM

## 2023-09-14 DIAGNOSIS — Z7984 Long term (current) use of oral hypoglycemic drugs: Secondary | ICD-10-CM | POA: Diagnosis not present

## 2023-09-14 DIAGNOSIS — I10 Essential (primary) hypertension: Secondary | ICD-10-CM

## 2023-09-14 DIAGNOSIS — M47816 Spondylosis without myelopathy or radiculopathy, lumbar region: Secondary | ICD-10-CM | POA: Diagnosis not present

## 2023-09-14 LAB — POCT GLYCOSYLATED HEMOGLOBIN (HGB A1C): HbA1c POC (<> result, manual entry): 5.2 % (ref 4.0–5.6)

## 2023-09-14 NOTE — Assessment & Plan Note (Signed)
 Diabetes is very well controlled with A1c of 5.1% today.  She will continue tirzepatide .  Continue at 10mg  strength.

## 2023-09-14 NOTE — Assessment & Plan Note (Signed)
BP is well controlled.  Discussed monitoring for hypotension as she loses weight.

## 2023-09-14 NOTE — Assessment & Plan Note (Signed)
 Continued low back pain. She will continue with PT.  Stopping naproxen  due to increase in creatinine.

## 2023-09-14 NOTE — Progress Notes (Signed)
 Sheila Gutierrez - 53 y.o. female MRN [id number]  Date of birth: Oct 01, 1970  Subjective Chief Complaint  Patient presents with   Diabetes    HPI Sheila Gutierrez is a 53 y.o. female here today for follow up visit.   She has done quite well with metformin  and mounjaro  for management of diabetes.  Her A1c is 5.1%.  Weight is down about 6-7 lbs since last visit.  Overall she is tolerating medications well.   BP is well controlled with losartan , chlorthalidone  and amlodipine .    Back pain is stable.  She is doing PT.  This is does seem to be helping some.  She is still on the fence about having surgery for her back. She has used naproxen  but doesn't think this has really helped.   ROS:  A comprehensive ROS was completed and negative except as noted per HPI  Allergies  Allergen Reactions   Lisinopril  Swelling    Lip angioedema    Past Medical History:  Diagnosis Date   Abnormal mammogram 11/29/2012   Repeat left diagnostic September 2014    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   Essential hypertension, benign 12/22/2012   Obesity 11/29/2012   Type 2 diabetes mellitus (HCC) 08/06/2014    Past Surgical History:  Procedure Laterality Date   REDUCTION MAMMAPLASTY Bilateral 2010    Social History   Socioeconomic History   Marital status: Single    Spouse name: Not on file   Number of children: Not on file   Years of education: Not on file   Highest education level: Some college, no degree  Occupational History   Not on file  Tobacco Use   Smoking status: Never   Smokeless tobacco: Never  Substance and Sexual Activity   Alcohol use: Yes    Comment: once a month   Drug use: No   Sexual activity: Yes  Other Topics Concern   Not on file  Social History Narrative   Not on file   Social Drivers of Health   Financial Resource Strain: Low Risk  (09/14/2023)   Overall Financial Resource Strain (CARDIA)    Difficulty of Paying Living  Expenses: Not hard at all  Food Insecurity: No Food Insecurity (09/14/2023)   Hunger Vital Sign    Worried About Running Out of Food in the Last Year: Never true    Ran Out of Food in the Last Year: Never true  Transportation Needs: No Transportation Needs (09/14/2023)   PRAPARE - Administrator, Civil Service (Medical): No    Lack of Transportation (Non-Medical): No  Physical Activity: Insufficiently Active (09/14/2023)   Exercise Vital Sign    Days of Exercise per Week: 3 days    Minutes of Exercise per Session: 20 min  Stress: No Stress Concern Present (09/14/2023)   [employer] of Occupational Health - Occupational Stress Questionnaire    Feeling of Stress : Only a little  Social Connections: Moderately Integrated (09/14/2023)   Social Connection and Isolation Panel [NHANES]    Frequency of Communication with Friends and Family: More than three times a week    Frequency of Social Gatherings with Friends and Family: More than three times a week    Attends Religious Services: More than 4 times per year    Active Member of [employer] or Organizations: Yes    Attends Engineer, structural: More than 4 times per year    Marital Status: Never married  Family History  Problem Relation Age of Onset   Pancreatic cancer Mother    Hypertension Mother        father   Hypertension Father    Breast cancer Cousin     Health Maintenance  Topic Date Due   COVID-19 Vaccine (6 - 2024-25 season) 05/01/2023   INFLUENZA VACCINE  11/28/2023 (Originally 03/31/2023)   FOOT EXAM  10/15/2023   OPHTHALMOLOGY EXAM  10/21/2023   HEMOGLOBIN A1C  03/13/2024   Colonoscopy  03/25/2024   Diabetic kidney evaluation - Urine ACR  07/19/2024   Diabetic kidney evaluation - eGFR measurement  09/06/2024   MAMMOGRAM  06/07/2025   Cervical Cancer Screening (HPV/Pap Cotest)  07/15/2026   DTaP/Tdap/Td (3 - Td or Tdap) 02/08/2033   Pneumococcal Vaccine 48-75 Years old  Completed   HIV Screening   Completed   Zoster Vaccines- Shingrix  Completed   HPV VACCINES  Aged Out   Hepatitis C Screening  Discontinued     ----------------------------------------------------------------------------------------------------------------------------------------------------------------------------------------------------------------- Physical Exam BP 112/75 (BP Location: Left Arm, Patient Position: Sitting, Cuff Size: Large)   Pulse (!) 111   Ht 5\' 11"  (1.803 m)   Wt 236 lb 8 oz (107.3 kg)   SpO2 100%   BMI 32.99 kg/m   Physical Exam Constitutional:      Appearance: Normal appearance.  HENT:     Head: Normocephalic and atraumatic.  Cardiovascular:     Rate and Rhythm: Normal rate and regular rhythm.  Pulmonary:     Effort: Pulmonary effort is normal.     Breath sounds: Normal breath sounds.  Neurological:     Mental Status: She is alert.  Psychiatric:        Mood and Affect: Mood normal.        Behavior: Behavior normal.     ------------------------------------------------------------------------------------------------------------------------------------------------------------------------------------------------------------------- Assessment and Plan  Lumbar spondylosis Continued low back pain. She will continue with PT.  Stopping naproxen  due to increase in creatinine.    Type 2 diabetes mellitus Diabetes is very well controlled with A1c of 5.1% today.  She will continue tirzepatide .  Continue at 10mg  strength.     Essential hypertension, benign BP is well controlled.  Discussed monitoring for hypotension as she loses weight.     No orders of the defined types were placed in this encounter.   No follow-ups on file.    This visit occurred during the SARS-CoV-2 public health emergency.  Safety protocols were in place, including screening questions prior to the visit, additional usage of staff PPE, and extensive cleaning of exam room while observing appropriate contact  time as indicated for disinfecting solutions.

## 2023-09-20 ENCOUNTER — Encounter: Payer: Self-pay | Admitting: Physical Therapy

## 2023-09-20 ENCOUNTER — Ambulatory Visit: Payer: No Typology Code available for payment source | Admitting: Physical Therapy

## 2023-09-20 DIAGNOSIS — M25552 Pain in left hip: Secondary | ICD-10-CM

## 2023-09-20 DIAGNOSIS — M6281 Muscle weakness (generalized): Secondary | ICD-10-CM

## 2023-09-20 NOTE — Therapy (Addendum)
 OUTPATIENT PHYSICAL THERAPY LOWER EXTREMITY TREATMENT AND RECERTIFICATION AND DISCHARGE   Patient Name: Sheila Gutierrez MRN: [id number] DOB:08-27-1971, 53 y.o., female Today's Date: 09/20/2023  END OF SESSION:  PT End of Session - 09/20/23 1515     Visit Number 4    Number of Visits 20    Date for PT Re-Evaluation 11/15/23    Authorization Type [employer]    PT Start Time 1438    PT Stop Time 1517    PT Time Calculation (min) 39 min    Activity Tolerance Patient tolerated treatment well    Behavior During Therapy [facility] for tasks assessed/performed                Past Medical History:  Diagnosis Date   Abnormal mammogram 11/29/2012   Repeat left diagnostic September 2014    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   Essential hypertension, benign 12/22/2012   Obesity 11/29/2012   Type 2 diabetes mellitus (HCC) 08/06/2014   Past Surgical History:  Procedure Laterality Date   REDUCTION MAMMAPLASTY Bilateral 2010   Patient Active Problem List   Diagnosis Date Noted   Pain of left hip 07/06/2023   Degenerative scoliosis in adult patient 06/19/2023   COVID-19 03/09/2023   Lumbar radiculopathy 02/09/2023   Well adult exam 06/11/2020   Abnormal menses 06/11/2020   Lumbar spondylosis 01/09/2020   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   Transaminitis 07/27/2017   Type 2 diabetes mellitus (HCC) 08/06/2014   Essential hypertension, benign 12/22/2012   Abnormal mammogram 11/29/2012   History of bilateral breast reduction surgery 11/29/2012   Morbid obesity (HCC) 11/29/2012   History of abnormal Pap smear 11/29/2012    PCP: Everrett Coombe  REFERRING PROVIDER: Everrett Coombe  REFERRING DIAG: Lt hip pain  THERAPY DIAG:  Pain in left hip  Muscle weakness (generalized)  Rationale for Evaluation and Treatment: Rehabilitation  ONSET DATE: 05/2023  SUBJECTIVE:   SUBJECTIVE STATEMENT: Pt reports she  still has pain but is "pushing through". Pain remains about a 7/10. She wakes from sleep at least 1x a night if she moves around in bed. She is going to a pain management doctor on Thursday  PERTINENT HISTORY: Pt states that she has had Lt hip/glute pain for about 3-4 weeks. Insidious onset. Pain increases when she walks or stands. She states she has pain in glutes and "heaviness" in Lt LE. Pt also reports she feels pain/pressure when sitting on Lt buttocks. She has difficulty sleeping due to pain. Pain decreases with stretching and ice. PAIN:  Are you having pain? Yes: NPRS scale: 7/10 Pain location: Lt glutes Pain description: heavy, sore, ache Aggravating factors: walk, stand, sit Relieving factors: stretch, ice  PRECAUTIONS: None  RED FLAGS: None   WEIGHT BEARING RESTRICTIONS: No  FALLS:  Has patient fallen in last 6 months? No   OCCUPATION: work from home, sits all day  PLOF: Independent  PATIENT GOALS: decrease pain  NEXT MD VISIT: PRN  OBJECTIVE:  Note: Objective measures were completed at Evaluation unless otherwise noted.  DIAGNOSTIC FINDINGS: x ray: No acute osseous abnormality. Mild degenerative changes of the hips.   PATIENT SURVEYS:  FOTO 36   MUSCLE LENGTH: Hamstrings: Right 110 deg; Left 90 deg   POSTURE: pt sits with all pressure on Rt buttocks due to Lt glute pain  PALPATION: TTP Lt greater trochanter, Lt piriformis, Lt glutes, Lt SIJ lateral border   Hip PROM: grossly [facility]  LOWER  EXTREMITY MMT:  MMT Right eval Left eval Rt/Lt 09/20/23  Hip flexion 3+ 3- pain 3 pain  Hip extension     Hip abduction  3 pain 4-  Hip adduction     Hip internal rotation   4  Hip external rotation  3 pain 3  Knee flexion     Knee extension     Ankle dorsiflexion     Ankle plantarflexion     Ankle inversion     Ankle eversion      (Blank rows = not tested)  LOWER EXTREMITY SPECIAL TESTS:  SLR negative bilat     TODAY'S TREATMENT:                                                                                                                               [facility] Adult PT Treatment:                                                DATE: 09/20/23 Therapeutic Exercise: Squat tap 2 x 10 Standing hip circles on slider 2 x 10 bilat Sidelying clam x 20 Reverse clam x 20 Hooklying march x 10 bilat Strength measurements (see above) Manual Therapy: STM Lt glutes, piriformis Trigger Point Dry Needling  Subsequent Treatment: Instructions provided previously at initial dry needling treatment.   Patient Verbal Consent Given: Yes Education Handout Provided: Previously Provided Muscles Treated: Lt glutes, piriformis Electrical Stimulation Performed: No Treatment Response/Outcome: palpable increase in muscle length  Therapeutic Activity: FOTO 36   [facility] Adult PT Treatment:                                                DATE: 09/07/23 Therapeutic Exercise: Heel slides x 10 bilat Hip add ball squeeze 15 x 3 sec hold Hooklying marching 2 x 10 Figure 4 piriformis stretch 2 x 30 sec Sidelying clam x 20 bilat Squat tap x 10 Standing hip circles on slider 2 x 10 bilat Manual Therapy: STM Lt glutes and piriformis Trigger Point Dry Needling  Subsequent Treatment: Instructions provided previously at initial dry needling treatment.   Patient Verbal Consent Given: Yes Education Handout Provided: Previously Provided Muscles Treated: Lt glutes, piriformis Electrical Stimulation Performed: No Treatment Response/Outcome: twitch response  Modalities: Ice pack Lt glutes x 10 minutes   [facility] Adult PT Treatment:                                                DATE: 08/03/23 Therapeutic Exercise: Heel slides 2 x 10 Bent knee fall outs  x 10 Hip adduction ball squeeze 2 x 10 Figure 4 piriformis stretch 2 x 30 sec Supine hip abd 2 x 10 Sidelying clam 2 x 10 Supine adductor squeeze with alternating knee ext x 10 Standing hip circle on slider x 10 - Lt LE only   Manual Therapy: STM Lt glutes/piriformis Trigger Point Dry-Needling  Treatment instructions: Expect mild to moderate muscle soreness. S/S of pneumothorax if dry needled over a lung field, and to seek immediate medical attention should they occur. Patient verbalized understanding of these instructions and education. Patient Consent Given: Yes Education handout provided: Previously provided Muscles treated: Lt piriformis Electrical stimulation performed: No Parameters: N/A Treatment response/outcome: increased muscle length  Modalities: Ice pack x 10 mins Lt glutes/piriformis    PATIENT EDUCATION:  Education details: PT POC and goals, HEP, dry needling Person educated: Patient Education method: Explanation, Demonstration, and Handouts Education comprehension: verbalized understanding and returned demonstration  HOME EXERCISE PROGRAM: Access Code: [email or web address] URL: [email or web address] Date: 09/07/2023 Prepared by: Reggy Eye  Exercises - Hooklying Heel Slide  - 1 x daily - 7 x weekly - 3 sets - 10 reps - Bent Knee Fallouts  - 1 x daily - 7 x weekly - 3 sets - 10 reps - Supine Hip Adduction Isometric with Ball  - 1 x daily - 7 x weekly - 2 sets - 10 reps - 3 seconds hold - Supine Figure 4 Piriformis Stretch  - 1 x daily - 7 x weekly - 1 sets - 3 reps - 20-30 seconds hold - Supine March  - 1 x daily - 7 x weekly - 3 sets - 10 reps - Clamshell  - 1 x daily - 7 x weekly - 3 sets - 10 reps - Standing Piriformis Release with Ball at Wall  - 1 x daily - 7 x weekly - 1 sets - 1 reps  ASSESSMENT:  CLINICAL IMPRESSION: Pt has improved Lt LE strength slightly and is slowing improving activity tolerance. She is making slow progress towards goals. Limited by pain, she sees pain specialist this week. She will continue to benefit from skilled PT to work towards goals and improved functional mobility  OBJECTIVE IMPAIRMENTS: decreased activity tolerance, difficulty walking,  decreased strength, increased muscle spasms, and pain.     GOALS: Goals reviewed with patient? Yes  SHORT TERM GOALS: Target date: 08/24/2023   Pt will be independent with initial HEP Baseline: Goal status: MET  2.  Pt will improve FOTO to >=45 Baseline:  Goal status: IN PROGRESS    LONG TERM GOALS: Target date: 11/15/2023      Pt will be independent with advanced HEP Baseline:  Goal status: INITIAL  2.  Pt will improve FOTO to >= 62 Baseline:  Goal status: INITIAL  3.  Pt will improve bilat LE strength to 4+/5 to return to exercise for fitness Baseline:  Goal status: IN PROGRESS  4.  Pt will sleep x 6 hours without being interrupted by pain Baseline:  Goal status: IN PROGRESS  5.  Pt will walk x 10 minutes with pain <= 3/10 Baseline:  Goal status: IN PROGRESS   PLAN:  PT FREQUENCY: 1-2x/week  PT DURATION: 8 weeks  PLANNED INTERVENTIONS: 97164- PT Re-evaluation, 97110-Therapeutic exercises, 97530- Therapeutic activity, [id number]- Neuromuscular re-education, 97535- Self Care, 21308- Manual therapy, [id number]- Aquatic Therapy, 97014- Electrical stimulation (unattended), 97035- Ultrasound, 65784- Ionotophoresis 4mg /ml Dexamethasone, Patient/Family education, Taping, Dry Needling, Cryotherapy, and Moist heat  PLAN FOR NEXT SESSION: Progress hip strength as tolerated,  manual/modalities as indicated  PHYSICAL THERAPY DISCHARGE SUMMARY  Visits from Start of Care: 4  Current functional level related to goals / functional outcomes: Increased strength and activity tolerance   Remaining deficits: See above   Education / Equipment: HEP   Patient agrees to discharge. Patient goals were partially met. Patient is being discharged due to not returning since the last visit. Reggy Eye, PT,DPT03/14/2510:07 AM   Reggy Eye, PT 09/20/2023, 3:17 PM

## 2023-10-14 ENCOUNTER — Other Ambulatory Visit: Payer: Self-pay | Admitting: Family Medicine

## 2023-11-07 ENCOUNTER — Other Ambulatory Visit: Payer: Self-pay | Admitting: Family Medicine

## 2023-11-07 DIAGNOSIS — E119 Type 2 diabetes mellitus without complications: Secondary | ICD-10-CM

## 2024-01-20 ENCOUNTER — Other Ambulatory Visit: Payer: Self-pay | Admitting: Family Medicine

## 2024-01-27 ENCOUNTER — Other Ambulatory Visit: Payer: Self-pay | Admitting: Family Medicine

## 2024-01-27 DIAGNOSIS — E119 Type 2 diabetes mellitus without complications: Secondary | ICD-10-CM

## 2024-01-31 ENCOUNTER — Other Ambulatory Visit: Payer: Self-pay | Admitting: Family Medicine

## 2024-01-31 DIAGNOSIS — I10 Essential (primary) hypertension: Secondary | ICD-10-CM

## 2024-03-02 ENCOUNTER — Other Ambulatory Visit: Payer: Self-pay | Admitting: Family Medicine

## 2024-03-07 ENCOUNTER — Ambulatory Visit: Admitting: Family Medicine

## 2024-03-07 VITALS — BP 91/59 | HR 87 | Resp 20 | Ht 71.0 in | Wt 224.0 lb

## 2024-03-07 DIAGNOSIS — E119 Type 2 diabetes mellitus without complications: Secondary | ICD-10-CM

## 2024-03-07 DIAGNOSIS — Z7984 Long term (current) use of oral hypoglycemic drugs: Secondary | ICD-10-CM

## 2024-03-07 DIAGNOSIS — I10 Essential (primary) hypertension: Secondary | ICD-10-CM

## 2024-03-07 LAB — POCT GLYCOSYLATED HEMOGLOBIN (HGB A1C): Hemoglobin A1C: 5.2 % (ref 4.0–5.6)

## 2024-03-07 MED ORDER — DULOXETINE HCL 30 MG PO CPEP: 30.0000 mg | ORAL_CAPSULE | Freq: Every day | ORAL | 2 refills | Status: AC

## 2024-03-07 MED ORDER — MOUNJARO 10 MG/0.5ML ~~LOC~~ SOAJ: 10.0000 mg | SUBCUTANEOUS | 1 refills | Status: AC

## 2024-03-07 NOTE — Assessment & Plan Note (Signed)
 BP is a low today.  D/c amlodipine  and return in 3 weeks for BP check.  Continue chlorthalidone  and losartan .

## 2024-03-07 NOTE — Progress Notes (Signed)
 Sheila Gutierrez - 53 y.o. female MRN [id number]  Date of birth: March 05, 1971  Subjective Chief Complaint  Patient presents with   Follow-up    Type 2 diabetes mellitus without complication, without long-term current use of insulin (HCC) mood    HPI Sheila Gutierrez is a 53 y.o. female here today for follow up visit.   She reports that she is doing quite well.    She continues on Mounjaro  and is doing well with this. Weight is down an additional 12lbs.  She is tolerating this pretty well.  Blood sugars remain well controlled.   BP is a little low today.  She denies symptoms of hypotension at this time.   Mood and chronic pain are stable.  No side effects with duloxetine  at current strength.    ROS:  A comprehensive ROS was completed and negative except as noted per HPI  Allergies  Allergen Reactions   Lisinopril  Swelling    Lip angioedema    Past Medical History:  Diagnosis Date   Abnormal mammogram 11/29/2012   Repeat left diagnostic September 2014    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   Essential hypertension, benign 12/22/2012   Obesity 11/29/2012   Type 2 diabetes mellitus (HCC) 08/06/2014    Past Surgical History:  Procedure Laterality Date   REDUCTION MAMMAPLASTY Bilateral 2010    Social History   Socioeconomic History   Marital status: Single    Spouse name: Not on file   Number of children: Not on file   Years of education: Not on file   Highest education level: Some college, no degree  Occupational History   Not on file  Tobacco Use   Smoking status: Never   Smokeless tobacco: Never  Substance and Sexual Activity   Alcohol use: Yes    Comment: once a month   Drug use: No   Sexual activity: Yes  Other Topics Concern   Not on file  Social History Narrative   Not on file   Social Drivers of Health   Financial Resource Strain: Low Risk  (03/07/2024)   Overall Financial Resource Strain (CARDIA)    Difficulty of  Paying Living Expenses: Not very hard  Food Insecurity: No Food Insecurity (03/07/2024)   Hunger Vital Sign    Worried About Running Out of Food in the Last Year: Never true    Ran Out of Food in the Last Year: Never true  Transportation Needs: No Transportation Needs (03/07/2024)   PRAPARE - Administrator, Civil Service (Medical): No    Lack of Transportation (Non-Medical): No  Physical Activity: Insufficiently Active (03/07/2024)   Exercise Vital Sign    Days of Exercise per Week: 3 days    Minutes of Exercise per Session: 30 min  Stress: No Stress Concern Present (03/07/2024)   [employer] of Occupational Health - Occupational Stress Questionnaire    Feeling of Stress: Only a little  Social Connections: Moderately Integrated (03/07/2024)   Social Connection and Isolation Panel    Frequency of Communication with Friends and Family: More than three times a week    Frequency of Social Gatherings with Friends and Family: More than three times a week    Attends Religious Services: More than 4 times per year    Active Member of [employer] or Organizations: Yes    Attends Banker Meetings: 1 to 4 times per year    Marital Status: Never married    Family  History  Problem Relation Age of Onset   Pancreatic cancer Mother    Hypertension Mother        father   Hypertension Father    Breast cancer Cousin     Health Maintenance  Topic Date Due   Hepatitis B Vaccines (1 of 3 - 19+ 3-dose series) Never done   FOOT EXAM  10/15/2023   OPHTHALMOLOGY EXAM  10/21/2023   Colonoscopy  03/25/2024   INFLUENZA VACCINE  03/30/2024   Diabetic kidney evaluation - Urine ACR  07/19/2024   Diabetic kidney evaluation - eGFR measurement  09/06/2024   HEMOGLOBIN A1C  09/07/2024   MAMMOGRAM  06/07/2025   Cervical Cancer Screening (HPV/Pap Cotest)  07/15/2026   DTaP/Tdap/Td (3 - Td or Tdap) 02/08/2033   Pneumococcal Vaccine 3-23 Years old  Completed   COVID-19 Vaccine  Completed    HIV Screening  Completed   Zoster Vaccines- Shingrix  Completed   HPV VACCINES  Aged Out   Meningococcal B Vaccine  Aged Out   Hepatitis C Screening  Discontinued     ----------------------------------------------------------------------------------------------------------------------------------------------------------------------------------------------------------------- Physical Exam BP (!) 91/59   Pulse 87   Resp 20   Ht 5' 11 (1.803 m)   Wt 224 lb (101.6 kg)   SpO2 100%   BMI 31.24 kg/m   Physical Exam Cardiovascular:     Rate and Rhythm: Normal rate and regular rhythm.  Pulmonary:     Effort: Pulmonary effort is normal.     Breath sounds: Normal breath sounds.  Neurological:     General: No focal deficit present.     Mental Status: She is alert.  Psychiatric:        Mood and Affect: Mood normal.        Behavior: Behavior normal.     ------------------------------------------------------------------------------------------------------------------------------------------------------------------------------------------------------------------- Assessment and Plan  Type 2 diabetes mellitus Diabetes is very well controlled with A1c of 5.2% today.  She will continue tirzepatide .  Continue at 10mg  strength.     Essential hypertension, benign BP is a low today.  D/c amlodipine  and return in 3 weeks for BP check.  Continue chlorthalidone  and losartan .    Meds ordered this encounter  Medications   DULoxetine  (CYMBALTA ) 30 MG capsule    Sig: Take 1 capsule (30 mg total) by mouth daily.    Dispense:  90 capsule    Refill:  2   tirzepatide  (MOUNJARO ) 10 MG/0.5ML Pen    Sig: Inject 10 mg into the skin once a week.    Dispense:  6 mL    Refill:  1    Return in about 5 months (around 08/07/2024) for Annual Exam.

## 2024-03-07 NOTE — Patient Instructions (Signed)
 Stop amlodipine .  Return in 3 weeks for BP check

## 2024-03-07 NOTE — Assessment & Plan Note (Signed)
 Diabetes is very well controlled with A1c of 5.2% today.  She will continue tirzepatide .  Continue at 10mg  strength.

## 2024-03-14 ENCOUNTER — Ambulatory Visit: Payer: No Typology Code available for payment source | Admitting: Family Medicine

## 2024-03-28 ENCOUNTER — Ambulatory Visit

## 2024-04-21 ENCOUNTER — Other Ambulatory Visit: Payer: Self-pay | Admitting: Family Medicine

## 2024-07-12 ENCOUNTER — Telehealth: Payer: Self-pay

## 2024-07-12 NOTE — Telephone Encounter (Signed)
 Should she be scheduled to have another PAP?

## 2024-07-13 ENCOUNTER — Encounter: Payer: Self-pay | Admitting: Family Medicine

## 2024-07-13 NOTE — Telephone Encounter (Signed)
 Patient scheduled. She is also due for a colonoscopy. She will talk with Dr Alvia during appointment.

## 2024-07-19 ENCOUNTER — Other Ambulatory Visit: Payer: Self-pay | Admitting: Family Medicine

## 2024-07-25 ENCOUNTER — Encounter: Admitting: Family Medicine

## 2024-07-25 ENCOUNTER — Other Ambulatory Visit: Payer: Self-pay | Admitting: Family Medicine

## 2024-07-25 DIAGNOSIS — I10 Essential (primary) hypertension: Secondary | ICD-10-CM

## 2024-08-02 ENCOUNTER — Encounter: Admitting: Family Medicine

## 2024-08-08 ENCOUNTER — Encounter: Admitting: Family Medicine

## 2024-08-15 ENCOUNTER — Encounter: Admitting: Family Medicine

## 2024-08-29 ENCOUNTER — Other Ambulatory Visit (HOSPITAL_COMMUNITY)
Admission: RE | Admit: 2024-08-29 | Discharge: 2024-08-29 | Disposition: A | Source: Ambulatory Visit | Attending: Family Medicine | Admitting: Family Medicine

## 2024-08-29 ENCOUNTER — Ambulatory Visit (INDEPENDENT_AMBULATORY_CARE_PROVIDER_SITE_OTHER): Admitting: Family Medicine

## 2024-08-29 VITALS — BP 121/75 | HR 105 | Ht 71.0 in | Wt 209.0 lb

## 2024-08-29 DIAGNOSIS — Z0000 Encounter for general adult medical examination without abnormal findings: Secondary | ICD-10-CM | POA: Diagnosis not present

## 2024-08-29 DIAGNOSIS — E119 Type 2 diabetes mellitus without complications: Secondary | ICD-10-CM | POA: Diagnosis not present

## 2024-08-29 DIAGNOSIS — I10 Essential (primary) hypertension: Secondary | ICD-10-CM | POA: Diagnosis not present

## 2024-08-29 DIAGNOSIS — N951 Menopausal and female climacteric states: Secondary | ICD-10-CM | POA: Diagnosis not present

## 2024-08-29 DIAGNOSIS — Z124 Encounter for screening for malignant neoplasm of cervix: Secondary | ICD-10-CM

## 2024-08-29 DIAGNOSIS — Z1211 Encounter for screening for malignant neoplasm of colon: Secondary | ICD-10-CM | POA: Diagnosis not present

## 2024-08-29 DIAGNOSIS — Z7985 Long-term (current) use of injectable non-insulin antidiabetic drugs: Secondary | ICD-10-CM

## 2024-08-29 DIAGNOSIS — Z7984 Long term (current) use of oral hypoglycemic drugs: Secondary | ICD-10-CM | POA: Diagnosis not present

## 2024-08-29 NOTE — Assessment & Plan Note (Signed)
 Well adult Orders Placed This Encounter  Procedures   CMP14+EGFR   CBC with Differential/Platelet   Lipid Panel With LDL/HDL Ratio   HgB A1c   Urine Microalbumin w/creat. ratio   FSH/LH   Ambulatory referral to Gastroenterology    Referral Priority:   Routine    Referral Type:   Consultation    Referral Reason:   Specialty Services Required    Number of Visits Requested:   1  Screenings: Pap completed. Today.  Additional screening per orders.  Placed referral to GI.  Immunizations:  UTD. Anticipatory guidance/Risk factor reduction:  Recommendations per AVS.

## 2024-08-29 NOTE — Progress Notes (Signed)
 " Sheila Gutierrez - 53 y.o. female MRN [id number]  Date of birth: May 05, 1971  Subjective Chief Complaint  Patient presents with   Annual Exam    HPI Sheila Gutierrez is a 53 y.o. female here today for annual exam.   She reports that she is doing well.   She is doing well with current medications.  Continues to work on dietary changes.   She has increased activity.    She is a non-smoker. Occasional EtOH.   She is due for pap and updated colon cancer screening.   Review of Systems  Constitutional:  Negative for chills, fever, malaise/fatigue and weight loss.  HENT:  Negative for congestion, ear pain and sore throat.   Eyes:  Negative for blurred vision, double vision and pain.  Respiratory:  Negative for cough and shortness of breath.   Cardiovascular:  Negative for chest pain and palpitations.  Gastrointestinal:  Negative for abdominal pain, blood in stool, constipation, heartburn and nausea.  Genitourinary:  Negative for dysuria and urgency.  Musculoskeletal:  Negative for joint pain and myalgias.  Neurological:  Negative for dizziness and headaches.  Endo/Heme/Allergies:  Does not bruise/bleed easily.  Psychiatric/Behavioral:  Negative for depression. The patient is not nervous/anxious and does not have insomnia.     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   Arthritis    Essential hypertension, benign 12/22/2012   Obesity 11/29/2012   Type 2 diabetes mellitus (HCC) 08/06/2014    Past Surgical History:  Procedure Laterality Date   APPENDECTOMY     CHOLECYSTECTOMY     REDUCTION MAMMAPLASTY Bilateral 2010    Social History   Socioeconomic History   Marital status: Single    Spouse name: Not on file   Number of children: Not on file   Years of education: Not on file   Highest education level: Some  college, no degree  Occupational History   Not on file  Tobacco Use   Smoking status: Never   Smokeless tobacco: Never  Substance and Sexual Activity   Alcohol use: Yes    Comment: Occasionally drinkcan go konths without a drink   Drug use: Never   Sexual activity: Not Currently    Birth control/protection: Abstinence, Condom, Pill  Other Topics Concern   Not on file  Social History Narrative   Not on file   Social Drivers of Health   Tobacco Use: Low Risk (03/07/2024)   Patient History    Smoking Tobacco Use: Never    Smokeless Tobacco Use: Never    Passive Exposure: Not on file  Financial Resource Strain: Low Risk (08/29/2024)   Overall Financial Resource Strain (CARDIA)    Difficulty of Paying Living Expenses: Not hard at all  Food Insecurity: No Food Insecurity (08/29/2024)   Epic    Worried About Radiation Protection Practitioner of Food in the Last Year: Never true    Ran Out of Food in the Last Year: Never true  Transportation Needs: No Transportation Needs (08/29/2024)   Epic    Lack of Transportation (Medical): No    Lack of Transportation (Non-Medical): No  Physical Activity: Insufficiently Active (08/29/2024)   Exercise Vital Sign    Days of Exercise per Week: 2 days    Minutes of Exercise per Session: 20 min  Stress: No Stress Concern Present (08/29/2024)   [employer] of  Occupational Health - Occupational Stress Questionnaire    Feeling of Stress: Not at all  Social Connections: Moderately Integrated (08/29/2024)   Social Connection and Isolation Panel    Frequency of Communication with Friends and Family: More than three times a week    Frequency of Social Gatherings with Friends and Family: Twice a week    Attends Religious Services: More than 4 times per year    Active Member of Clubs or Organizations: Yes    Attends Banker Meetings: More than 4 times per year    Marital Status: Never married  Depression (PHQ2-9): Low Risk (03/07/2024)   Depression  (PHQ2-9)    PHQ-2 Score: 0  Alcohol Screen: Low Risk (08/29/2024)   Alcohol Screen    Last Alcohol Screening Score (AUDIT): 2  Housing: Low Risk (08/29/2024)   Epic    Unable to Pay for Housing in the Last Year: No    Number of Times Moved in the Last Year: 0    Homeless in the Last Year: No  Utilities: Not At Risk (09/22/2023)   Received from [facility] Utilities    Threatened with loss of utilities: No  Health Literacy: Not on file    Family History  Problem Relation Age of Onset   Pancreatic cancer Mother    Hypertension Mother        father   Cancer Mother    Arthritis Mother    Hypertension Father    Breast cancer Cousin    Diabetes Maternal Aunt     Health Maintenance  Topic Date Due   Hepatitis B Vaccines 19-59 Average Risk (1 of 3 - 19+ 3-dose series) Never done   Cervical Cancer Screening (Pap smear)  07/15/2022   OPHTHALMOLOGY EXAM  10/21/2023   Colonoscopy  03/25/2024   Diabetic kidney evaluation - Urine ACR  07/19/2024   Diabetic kidney evaluation - eGFR measurement  09/06/2024   HEMOGLOBIN A1C  09/07/2024   Mammogram  06/07/2025   FOOT EXAM  08/29/2025   DTaP/Tdap/Td (3 - Td or Tdap) 02/08/2033   Pneumococcal Vaccine: 50+ Years  Completed   Influenza Vaccine  Completed   COVID-19 Vaccine  Completed   HIV Screening  Completed   Zoster Vaccines- Shingrix  Completed   HPV VACCINES  Aged Out   Meningococcal B Vaccine  Aged Out   Hepatitis C Screening  Discontinued     ----------------------------------------------------------------------------------------------------------------------------------------------------------------------------------------------------------------- Physical Exam BP 121/75   Pulse (!) 105   Ht 5' 11 (1.803 m)   Wt 209 lb (94.8 kg)   SpO2 100%   BMI 29.15 kg/m   Physical Exam Exam conducted with a chaperone present [employer], CMA).  Constitutional:      General: She is not in acute distress. HENT:      Head: Normocephalic and atraumatic.     Right Ear: Tympanic membrane and ear canal normal.     Left Ear: Tympanic membrane and ear canal normal.     Nose: Nose normal.  Eyes:     General: No scleral icterus.    Conjunctiva/sclera: Conjunctivae normal.  Neck:     Thyroid: No thyromegaly.  Cardiovascular:     Rate and Rhythm: Normal rate and regular rhythm.     Heart sounds: Normal heart sounds.  Pulmonary:     Effort: Pulmonary effort is normal.     Breath sounds: Normal breath sounds.  Abdominal:     General: Bowel sounds are normal. There is no  distension.     Palpations: Abdomen is soft.     Tenderness: There is no abdominal tenderness. There is no guarding.  Genitourinary:    Labia:        Right: No rash, tenderness, lesion or injury.        Left: No rash, tenderness, lesion or injury.      Cervix: Discharge (scant, milky) and friability present. No cervical motion tenderness, erythema or eversion.     Uterus: Normal.      Adnexa: Right adnexa normal and left adnexa normal.     Comments: PAP completed.  Musculoskeletal:        General: Normal range of motion.     Cervical back: Normal range of motion and neck supple.  Lymphadenopathy:     Cervical: No cervical adenopathy.  Skin:    General: Skin is warm and dry.     Findings: No rash.  Neurological:     General: No focal deficit present.     Mental Status: She is alert and oriented to person, place, and time.     Cranial Nerves: No cranial nerve deficit.     Coordination: Coordination normal.  Psychiatric:        Mood and Affect: Mood normal.        Behavior: Behavior normal.     ------------------------------------------------------------------------------------------------------------------------------------------------------------------------------------------------------------------- Assessment and Plan  Well adult exam Well adult Orders Placed This Encounter  Procedures   CMP14+EGFR   CBC with  Differential/Platelet   Lipid Panel With LDL/HDL Ratio   HgB A1c   Urine Microalbumin w/creat. ratio   FSH/LH   Ambulatory referral to Gastroenterology    Referral Priority:   Routine    Referral Type:   Consultation    Referral Reason:   Specialty Services Required    Number of Visits Requested:   1  Screenings: Pap completed. Today.  Additional screening per orders.  Placed referral to GI.  Immunizations:  UTD. Anticipatory guidance/Risk factor reduction:  Recommendations per AVS.    No orders of the defined types were placed in this encounter.   No follow-ups on file.        [1]  Allergies Allergen Reactions   Lisinopril  Swelling    Lip angioedema   "

## 2024-08-29 NOTE — Patient Instructions (Signed)
 Preventive Care 53-53 Years Old, Female  Preventive care refers to lifestyle choices and visits with your health care provider that can promote health and wellness. Preventive care visits are also called wellness exams.  What can I expect for my preventive care visit?  Counseling  Your health care provider may ask you questions about your:  Medical history, including:  Past medical problems.  Family medical history.  Pregnancy history.  Current health, including:  Menstrual cycle.  Method of birth control.  Emotional well-being.  Home life and relationship well-being.  Sexual activity and sexual health.  Lifestyle, including:  Alcohol, nicotine or tobacco, and drug use.  Access to firearms.  Diet, exercise, and sleep habits.  Work and work Astronomer.  Sunscreen use.  Safety issues such as seatbelt and bike helmet use.  Physical exam  Your health care provider will check your:  Height and weight. These may be used to calculate your BMI (body mass index). BMI is a measurement that tells if you are at a healthy weight.  Waist circumference. This measures the distance around your waistline. This measurement also tells if you are at a healthy weight and may help predict your risk of certain diseases, such as type 2 diabetes and high blood pressure.  Heart rate and blood pressure.  Body temperature.  Skin for abnormal spots.  What immunizations do I need?    Vaccines are usually given at various ages, according to a schedule. Your health care provider will recommend vaccines for you based on your age, medical history, and lifestyle or other factors, such as travel or where you work.  What tests do I need?  Screening  Your health care provider may recommend screening tests for certain conditions. This may include:  Lipid and cholesterol levels.  Diabetes screening. This is done by checking your blood sugar (glucose) after you have not eaten for a while (fasting).  Pelvic exam and Pap test.  Hepatitis B test.  Hepatitis C  test.  HIV (human immunodeficiency virus) test.  STI (sexually transmitted infection) testing, if you are at risk.  Lung cancer screening.  Colorectal cancer screening.  Mammogram. Talk with your health care provider about when you should start having regular mammograms. This may depend on whether you have a family history of breast cancer.  BRCA-related cancer screening. This may be done if you have a family history of breast, ovarian, tubal, or peritoneal cancers.  Bone density scan. This is done to screen for osteoporosis.  Talk with your health care provider about your test results, treatment options, and if necessary, the need for more tests.  Follow these instructions at home:  Eating and drinking    Eat a diet that includes fresh fruits and vegetables, whole grains, lean protein, and low-fat dairy products.  Take vitamin and mineral supplements as recommended by your health care provider.  Do not drink alcohol if:  Your health care provider tells you not to drink.  You are pregnant, may be pregnant, or are planning to become pregnant.  If you drink alcohol:  Limit how much you have to 0-1 drink a day.  Know how much alcohol is in your drink. In the [place] one drink equals one 12 oz bottle of beer (355 mL), one 5 oz glass of wine (148 mL), or one 1 oz glass of hard liquor (44 mL).  Lifestyle  Brush your teeth every morning and night with fluoride toothpaste. Floss one time each day.  Exercise for at least  30 minutes 5 or more days each week.  Do not use any products that contain nicotine or tobacco. These products include cigarettes, chewing tobacco, and vaping devices, such as e-cigarettes. If you need help quitting, ask your health care provider.  Do not use drugs.  If you are sexually active, practice safe sex. Use a condom or other form of protection to prevent STIs.  If you do not wish to become pregnant, use a form of birth control. If you plan to become pregnant, see your health care provider for a  prepregnancy visit.  Take aspirin only as told by your health care provider. Make sure that you understand how much to take and what form to take. Work with your health care provider to find out whether it is safe and beneficial for you to take aspirin daily.  Find healthy ways to manage stress, such as:  Meditation, yoga, or listening to music.  Journaling.  Talking to a trusted person.  Spending time with friends and family.  Minimize exposure to UV radiation to reduce your risk of skin cancer.  Safety  Always wear your seat belt while driving or riding in a vehicle.  Do not drive:  If you have been drinking alcohol. Do not ride with someone who has been drinking.  When you are tired or distracted.  While texting.  If you have been using any mind-altering substances or drugs.  Wear a helmet and other protective equipment during sports activities.  If you have firearms in your house, make sure you follow all gun safety procedures.  Seek help if you have been physically or sexually abused.  What's next?  Visit your health care provider once a year for an annual wellness visit.  Ask your health care provider how often you should have your eyes and teeth checked.  Stay up to date on all vaccines.  This information is not intended to replace advice given to you by your health care provider. Make sure you discuss any questions you have with your health care provider.  Document Revised: 02/11/2021 Document Reviewed: 02/11/2021  Elsevier Patient Education  2024 [employer].

## 2024-08-30 LAB — CBC WITH DIFFERENTIAL/PLATELET
Basophils Absolute: 0.1 x10E3/uL (ref 0.0–0.2)
Basos: 1 %
EOS (ABSOLUTE): 0.3 x10E3/uL (ref 0.0–0.4)
Eos: 4 %
Hematocrit: 40.3 % (ref 34.0–46.6)
Hemoglobin: 12.8 g/dL (ref 11.1–15.9)
Immature Grans (Abs): 0 x10E3/uL (ref 0.0–0.1)
Immature Granulocytes: 0 %
Lymphocytes Absolute: 2.8 x10E3/uL (ref 0.7–3.1)
Lymphs: 32 %
MCH: 29.6 pg (ref 26.6–33.0)
MCHC: 31.8 g/dL (ref 31.5–35.7)
MCV: 93 fL (ref 79–97)
Monocytes Absolute: 0.7 x10E3/uL (ref 0.1–0.9)
Monocytes: 8 %
Neutrophils Absolute: 4.9 x10E3/uL (ref 1.4–7.0)
Neutrophils: 55 %
Platelets: 298 x10E3/uL (ref 150–450)
RBC: 4.32 x10E6/uL (ref 3.77–5.28)
RDW: 13.2 % (ref 11.7–15.4)
WBC: 8.9 x10E3/uL (ref 3.4–10.8)

## 2024-08-30 LAB — CMP14+EGFR
ALT: 15 IU/L (ref 0–32)
AST: 16 IU/L (ref 0–40)
Albumin: 4.4 g/dL (ref 3.8–4.9)
Alkaline Phosphatase: 90 IU/L (ref 49–135)
BUN/Creatinine Ratio: 17 (ref 9–23)
BUN: 19 mg/dL (ref 6–24)
Bilirubin Total: 0.5 mg/dL (ref 0.0–1.2)
CO2: 21 mmol/L (ref 20–29)
Calcium: 9.9 mg/dL (ref 8.7–10.2)
Chloride: 103 mmol/L (ref 96–106)
Creatinine, Ser: 1.13 mg/dL — ABNORMAL HIGH (ref 0.57–1.00)
Globulin, Total: 2.8 g/dL (ref 1.5–4.5)
Glucose: 81 mg/dL (ref 70–99)
Potassium: 4.4 mmol/L (ref 3.5–5.2)
Sodium: 141 mmol/L (ref 134–144)
Total Protein: 7.2 g/dL (ref 6.0–8.5)
eGFR: 58 mL/min/1.73 — ABNORMAL LOW

## 2024-08-30 LAB — LIPID PANEL WITH LDL/HDL RATIO
Cholesterol, Total: 191 mg/dL (ref 100–199)
HDL: 88 mg/dL
LDL Chol Calc (NIH): 84 mg/dL (ref 0–99)
LDL/HDL Ratio: 1 ratio (ref 0.0–3.2)
Triglycerides: 107 mg/dL (ref 0–149)
VLDL Cholesterol Cal: 19 mg/dL (ref 5–40)

## 2024-08-30 LAB — FSH/LH
FSH: 77.1 m[IU]/mL
LH: 65.7 m[IU]/mL

## 2024-08-30 LAB — HEMOGLOBIN A1C
Est. average glucose Bld gHb Est-mCnc: 97 mg/dL
Hgb A1c MFr Bld: 5 % (ref 4.8–5.6)

## 2024-08-30 LAB — MICROALBUMIN / CREATININE URINE RATIO
Creatinine, Urine: 262.2 mg/dL
Microalb/Creat Ratio: 31 mg/g{creat} — ABNORMAL HIGH (ref 0–29)
Microalbumin, Urine: 80 ug/mL

## 2024-09-02 ENCOUNTER — Other Ambulatory Visit: Payer: Self-pay | Admitting: Family Medicine

## 2024-09-06 LAB — CYTOLOGY - PAP
Chlamydia: NEGATIVE
Comment: NEGATIVE
Comment: NEGATIVE
Comment: NEGATIVE
Comment: NORMAL
Diagnosis: NEGATIVE
High risk HPV: NEGATIVE
Neisseria Gonorrhea: NEGATIVE
Trichomonas: NEGATIVE

## 2024-09-07 ENCOUNTER — Ambulatory Visit: Payer: Self-pay | Admitting: Family Medicine

## 2025-02-27 ENCOUNTER — Ambulatory Visit: Admitting: Family Medicine
